# Patient Record
Sex: Female | Born: 1955
Health system: Southern US, Community
[De-identification: ages and names within clinical notes are randomized; demographics above are authoritative.]

## PROBLEM LIST (undated history)

## (undated) DIAGNOSIS — B019 Varicella without complication: Secondary | ICD-10-CM

## (undated) DIAGNOSIS — H269 Unspecified cataract: Secondary | ICD-10-CM

## (undated) HISTORY — DX: Varicella without complication: B01.9

## (undated) HISTORY — PX: WISDOM TOOTH EXTRACTION: SHX21

## (undated) HISTORY — DX: Unspecified cataract: H26.9

---

## 2013-09-13 HISTORY — PX: EYE SURGERY: SHX253

## 2015-06-30 ENCOUNTER — Encounter: Payer: Self-pay | Admitting: Nurse Practitioner

## 2015-06-30 ENCOUNTER — Ambulatory Visit (INDEPENDENT_AMBULATORY_CARE_PROVIDER_SITE_OTHER): Payer: BLUE CROSS/BLUE SHIELD | Admitting: Nurse Practitioner

## 2015-06-30 VITALS — BP 122/82 | HR 71 | Temp 98.4°F | Resp 16 | Ht 63.0 in | Wt 179.9 lb

## 2015-06-30 DIAGNOSIS — Z0000 Encounter for general adult medical examination without abnormal findings: Secondary | ICD-10-CM | POA: Diagnosis not present

## 2015-06-30 DIAGNOSIS — R739 Hyperglycemia, unspecified: Secondary | ICD-10-CM

## 2015-06-30 DIAGNOSIS — Z7689 Persons encountering health services in other specified circumstances: Secondary | ICD-10-CM | POA: Insufficient documentation

## 2015-06-30 NOTE — Progress Notes (Signed)
Patient ID: Erin Moses, female    DOB: 1956-04-04  Age: 59 y.o. MRN: [id number]  CC: Establish Care   HPI Erin Moses presents for establishing care with no chief complaints today.  1) New pt info:  Immunizations- tdap 2007, refuses flu, interested in Zostavax   Mammogram- 2010, normal per pt, self breast exams   Wants to wait till next year   Pap- 2015- normal per pt  Colonoscopy- Not UTD   Eye Exam- 12/2014, cataract in right eye removed   Dental Exam- dentures- partial upper, Not UTD  LMP- 2005   2) Chronic Problems-  Obese- 3-4 x a week going to [employer]   Diet- Cooks mostly   History Erin Moses has a past medical history of Chicken pox.   She has past surgical history that includes Eye surgery (2015).   Her family history includes Alcohol abuse in her father; Diabetes in her father; Heart disease in her paternal grandfather.She reports that she quit smoking about 8 years ago. Her smoking use included Cigarettes. She has never used smokeless tobacco. She reports that she drinks alcohol. She reports that she does not use illicit drugs.  No outpatient prescriptions prior to visit.   No facility-administered medications prior to visit.    ROS Review of Systems  Constitutional: Negative for fever, chills, diaphoresis and fatigue.  HENT: Negative for tinnitus and trouble swallowing.   Eyes: Negative for visual disturbance.  Respiratory: Negative for chest tightness, shortness of breath and wheezing.   Cardiovascular: Negative for chest pain, palpitations and leg swelling.  Gastrointestinal: Negative for nausea, vomiting and diarrhea.  Genitourinary: Negative for vaginal bleeding, vaginal discharge, difficulty urinating and vaginal pain.  Musculoskeletal: Negative for back pain and neck pain.  Skin: Negative for rash.  Neurological: Negative for dizziness, weakness, numbness and headaches.  Hematological: Does not bruise/bleed easily.  Psychiatric/Behavioral:  The patient is not nervous/anxious.     Objective:  BP 122/82 mmHg  Pulse 71  Temp(Src) 98.4 F (36.9 C)  Resp 16  Ht  (1.6 m)  Wt 179 lb 14.4 oz (81.602 kg)  BMI 31.88 kg/m2  SpO2 94%  Physical Exam  Constitutional: She is oriented to person, place, and time. She appears well-developed and well-nourished. No distress.  HENT:  Head: Normocephalic and atraumatic.  Right Ear: External ear normal.  Left Ear: External ear normal.  TMs clear bilaterally  Eyes: EOM are normal. Pupils are equal, round, and reactive to light. Right eye exhibits no discharge. Left eye exhibits no discharge. No scleral icterus.  Neck: Normal range of motion. Neck supple.  Cardiovascular: Normal rate, regular rhythm, normal heart sounds and intact distal pulses.  Exam reveals no gallop and no friction rub.   No murmur heard. Pulmonary/Chest: Effort normal and breath sounds normal. No respiratory distress. She has no wheezes. She has no rales. She exhibits no tenderness.  Abdominal: Soft. Bowel sounds are normal. She exhibits no distension and no mass. There is no tenderness. There is no rebound and no guarding.  Genitourinary:  Deferred per patient request  Musculoskeletal: Normal range of motion. She exhibits no edema or tenderness.  Lymphadenopathy:    She has no cervical adenopathy.  Neurological: She is alert and oriented to person, place, and time. No cranial nerve deficit. She exhibits normal muscle tone. Coordination normal.  Skin: Skin is warm and dry. No rash noted. She is not diaphoretic.  Psychiatric: She has a normal mood and affect. Her behavior is normal. Judgment and thought  content normal.      Assessment & Plan:   Erin Moses was seen today for establish care.  Diagnoses and all orders for this visit:  Encounter to establish care  Routine general medical examination at a health care facility -     Comprehensive metabolic panel; Future -     Hemoglobin A1c; Future -     Lipid  panel; Future -     CBC with Differential/Platelet; Future -     Vit D  25 hydroxy (rtn osteoporosis monitoring); Future  Elevated blood sugar   I am having Erin Moses maintain her aspirin, Multiple Vitamin (MULTI VITAMIN DAILY PO), and Melatonin.  Meds ordered this encounter  Medications  . aspirin 325 MG tablet    Sig: Take 1 tablet by mouth daily.  . Multiple Vitamin (MULTI VITAMIN DAILY PO)    Sig: Take 1 tablet by mouth daily.  . Melatonin 10 MG TBCR    Sig: Take 1 tablet by mouth at bedtime as needed.     Follow-up: Return if symptoms worsen or fail to improve, for Lab Appointment- Fasting labs.

## 2015-06-30 NOTE — Progress Notes (Signed)
Pre visit review using our clinic review tool, if applicable. No additional management support is needed unless otherwise documented below in the visit note. 

## 2015-06-30 NOTE — Patient Instructions (Signed)

## 2015-06-30 NOTE — Assessment & Plan Note (Signed)
Discussed acute and chronic issues. Reviewed health maintenance measures, PFSHx, and immunizations. Obtain routine labs TSH, Lipid panel, CBC w/ diff, A1c, and CMET.   

## 2015-07-02 DIAGNOSIS — R739 Hyperglycemia, unspecified: Secondary | ICD-10-CM | POA: Insufficient documentation

## 2015-07-02 DIAGNOSIS — Z87898 Personal history of other specified conditions: Secondary | ICD-10-CM | POA: Insufficient documentation

## 2015-07-02 DIAGNOSIS — Z8619 Personal history of other infectious and parasitic diseases: Secondary | ICD-10-CM | POA: Insufficient documentation

## 2015-07-02 NOTE — Assessment & Plan Note (Signed)
No CC today. Will do head to toe physical. Pt declined breast exam (seemed very cautious at today's visit).   Pt wants to wait on the following until next year- PAP, Breast exam, Mammogram.   Will obtain fasting labs at a lab visit.

## 2015-07-02 NOTE — Assessment & Plan Note (Signed)
Will obtain C met

## 2015-07-23 ENCOUNTER — Other Ambulatory Visit: Payer: Self-pay | Admitting: Nurse Practitioner

## 2015-07-23 ENCOUNTER — Telehealth: Payer: Self-pay | Admitting: Nurse Practitioner

## 2015-07-23 ENCOUNTER — Other Ambulatory Visit (INDEPENDENT_AMBULATORY_CARE_PROVIDER_SITE_OTHER): Payer: BLUE CROSS/BLUE SHIELD

## 2015-07-23 DIAGNOSIS — Z0000 Encounter for general adult medical examination without abnormal findings: Secondary | ICD-10-CM | POA: Diagnosis not present

## 2015-07-23 LAB — COMPREHENSIVE METABOLIC PANEL
ALBUMIN: 4.4 g/dL (ref 3.5–5.2)
ALT: 25 U/L (ref 0–35)
AST: 21 U/L (ref 0–37)
Alkaline Phosphatase: 91 U/L (ref 39–117)
BUN: 14 mg/dL (ref 6–23)
CHLORIDE: 99 meq/L (ref 96–112)
CO2: 27 meq/L (ref 19–32)
Calcium: 10.5 mg/dL (ref 8.4–10.5)
Creatinine, Ser: 0.81 mg/dL (ref 0.40–1.20)
GFR: 76.83 mL/min (ref >60.00)
Glucose, Bld: 108 mg/dL — ABNORMAL HIGH (ref 70–99)
POTASSIUM: 4.5 meq/L (ref 3.5–5.1)
SODIUM: 137 meq/L (ref 135–145)
Total Bilirubin: 0.7 mg/dL (ref 0.2–1.2)
Total Protein: 7.7 g/dL (ref 6.0–8.3)

## 2015-07-23 LAB — CBC WITH DIFFERENTIAL/PLATELET
BASOS PCT: 0.9 % (ref 0.0–3.0)
Basophils Absolute: 0.1 10*3/uL (ref 0.0–0.1)
EOS PCT: 1.8 % (ref 0.0–5.0)
Eosinophils Absolute: 0.1 10*3/uL (ref 0.0–0.7)
HEMATOCRIT: 42.4 % (ref 36.0–46.0)
HEMOGLOBIN: 14.2 g/dL (ref 12.0–15.0)
LYMPHS PCT: 29.5 % (ref 12.0–46.0)
Lymphs Abs: 2.1 10*3/uL (ref 0.7–4.0)
MCHC: 33.4 g/dL (ref 30.0–36.0)
MCV: 84.8 fl (ref 78.0–100.0)
MONOS PCT: 9.1 % (ref 3.0–12.0)
Monocytes Absolute: 0.6 10*3/uL (ref 0.1–1.0)
Neutro Abs: 4.1 10*3/uL (ref 1.4–7.7)
Neutrophils Relative %: 58.7 % (ref 43.0–77.0)
Platelets: 379 10*3/uL (ref 150.0–400.0)
RBC: 5 Mil/uL (ref 3.87–5.11)
RDW: 13.3 % (ref 11.5–15.5)
WBC: 7 10*3/uL (ref 4.0–10.5)

## 2015-07-23 LAB — LIPID PANEL
CHOLESTEROL: 207 mg/dL — AB (ref 0–200)
HDL: 53.7 mg/dL (ref >39.00)
LDL Cholesterol: 122 mg/dL — ABNORMAL HIGH (ref 0–99)
NONHDL: 153.31
Total CHOL/HDL Ratio: 4
Triglycerides: 155 mg/dL — ABNORMAL HIGH (ref 0.0–149.0)
VLDL: 31 mg/dL (ref 0.0–40.0)

## 2015-07-23 LAB — VITAMIN D 25 HYDROXY (VIT D DEFICIENCY, FRACTURES): VITD: 37.86 ng/mL (ref 30.00–100.00)

## 2015-07-23 LAB — HEMOGLOBIN A1C: HEMOGLOBIN A1C: 5.8 % (ref 4.6–6.5)

## 2015-07-23 MED ORDER — SCOPOLAMINE 1 MG/3DAYS TD PT72: 1.0000 | MEDICATED_PATCH | TRANSDERMAL | Status: DC

## 2015-07-23 NOTE — Telephone Encounter (Addendum)
Pt came in wanting to know if she could get a prescription for motion sickness patch pt is going on a cruise   .Marland Kitchen. Please advise pt

## 2015-07-23 NOTE — Telephone Encounter (Signed)
Message left on patient voicemail.

## 2015-07-23 NOTE — Telephone Encounter (Signed)
Can patient just purchased Dramamine or Bonne for patient

## 2015-07-23 NOTE — Telephone Encounter (Signed)
Sent to [facility] in [place] the scopolamine patch.

## 2015-07-23 NOTE — Telephone Encounter (Signed)
I'm confused sorry- is this cancelled shes already bought this? I can call in a scopolamine patch right now if she needs. Let me know which. Thanks!

## 2015-07-23 NOTE — Telephone Encounter (Signed)
Yes she wants something call in. Or can I recommend those medicine OTC?

## 2015-07-29 ENCOUNTER — Telehealth: Payer: Self-pay | Admitting: Nurse Practitioner

## 2015-07-29 NOTE — Telephone Encounter (Signed)
Patient called for lab results.

## 2015-07-29 NOTE — Telephone Encounter (Signed)
Spoke with patient, documentation is in a result note.

## 2018-01-30 ENCOUNTER — Telehealth: Payer: Self-pay | Admitting: Adult Health

## 2018-03-21 ENCOUNTER — Encounter: Payer: Self-pay | Admitting: Adult Health

## 2018-03-21 ENCOUNTER — Ambulatory Visit: Payer: BLUE CROSS/BLUE SHIELD | Admitting: Adult Health

## 2018-03-21 VITALS — BP 130/73 | HR 71 | Ht 63.0 in | Wt 183.5 lb

## 2018-03-21 DIAGNOSIS — Z1231 Encounter for screening mammogram for malignant neoplasm of breast: Secondary | ICD-10-CM

## 2018-03-21 DIAGNOSIS — Z23 Encounter for immunization: Secondary | ICD-10-CM | POA: Diagnosis not present

## 2018-03-21 DIAGNOSIS — Z0000 Encounter for general adult medical examination without abnormal findings: Secondary | ICD-10-CM

## 2018-03-21 DIAGNOSIS — Z114 Encounter for screening for human immunodeficiency virus [HIV]: Secondary | ICD-10-CM | POA: Diagnosis not present

## 2018-03-21 DIAGNOSIS — Z122 Encounter for screening for malignant neoplasm of respiratory organs: Secondary | ICD-10-CM

## 2018-03-21 DIAGNOSIS — R739 Hyperglycemia, unspecified: Secondary | ICD-10-CM

## 2018-03-21 DIAGNOSIS — Z1239 Encounter for other screening for malignant neoplasm of breast: Secondary | ICD-10-CM

## 2018-03-21 DIAGNOSIS — Z1159 Encounter for screening for other viral diseases: Secondary | ICD-10-CM | POA: Diagnosis not present

## 2018-03-21 DIAGNOSIS — L918 Other hypertrophic disorders of the skin: Secondary | ICD-10-CM

## 2018-03-21 NOTE — Patient Instructions (Signed)
Mediterranean Diet A Mediterranean diet refers to food and lifestyle choices that are based on the traditions of countries located on the [place] Sea. This way of eating has been shown to help prevent certain conditions and improve outcomes for people who have chronic diseases, like kidney disease and heart disease. What are tips for following this plan? Lifestyle  Cook and eat meals together with your family, when possible.  Drink enough fluid to keep your urine clear or pale yellow.  Be physically active every day. This includes: ? Aerobic exercise like running or swimming. ? Leisure activities like gardening, walking, or housework.  Get 7-8 hours of sleep each night.  If recommended by your health care provider, drink red wine in moderation. This means 1 glass a day for nonpregnant women and 2 glasses a day for men. A glass of wine equals 5 oz (150 mL). Reading food labels  Check the serving size of packaged foods. For foods such as rice and pasta, the serving size refers to the amount of cooked product, not dry.  Check the total fat in packaged foods. Avoid foods that have saturated fat or trans fats.  Check the ingredients list for added sugars, such as corn syrup. Shopping  At the grocery store, buy most of your food from the areas near the walls of the store. This includes: ? Fresh fruits and vegetables (produce). ? Grains, beans, nuts, and seeds. Some of these may be available in unpackaged forms or large amounts (in bulk). ? Fresh seafood. ? Poultry and eggs. ? Low-fat dairy products.  Buy whole ingredients instead of prepackaged foods.  Buy fresh fruits and vegetables in-season from local farmers markets.  Buy frozen fruits and vegetables in resealable bags.  If you do not have access to quality fresh seafood, buy precooked frozen shrimp or canned fish, such as tuna, salmon, or sardines.  Buy small amounts of raw or cooked vegetables, salads, or olives from the  deli or salad bar at your store.  Stock your pantry so you always have certain foods on hand, such as olive oil, canned tuna, canned tomatoes, rice, pasta, and beans. Cooking  Cook foods with extra-virgin olive oil instead of using butter or other vegetable oils.  Have meat as a side dish, and have vegetables or grains as your main dish. This means having meat in small portions or adding small amounts of meat to foods like pasta or stew.  Use beans or vegetables instead of meat in common dishes like chili or lasagna.  Experiment with different cooking methods. Try roasting or broiling vegetables instead of steaming or sauteing them.  Add frozen vegetables to soups, stews, pasta, or rice.  Add nuts or seeds for added healthy fat at each meal. You can add these to yogurt, salads, or vegetable dishes.  Marinate fish or vegetables using olive oil, lemon juice, garlic, and fresh herbs. Meal planning  Plan to eat 1 vegetarian meal one day each week. Try to work up to 2 vegetarian meals, if possible.  Eat seafood 2 or more times a week.  Have healthy snacks readily available, such as: ? Vegetable sticks with hummus. ? Greek yogurt. ? Fruit and nut trail mix.  Eat balanced meals throughout the week. This includes: ? Fruit: 2-3 servings a day ? Vegetables: 4-5 servings a day ? Low-fat dairy: 2 servings a day ? Fish, poultry, or lean meat: 1 serving a day ? Beans and legumes: 2 or more servings a week ? Nuts   and seeds: 1-2 servings a day ? Whole grains: 6-8 servings a day ? Extra-virgin olive oil: 3-4 servings a day  Limit red meat and sweets to only a few servings a month What are my food choices?  Mediterranean diet ? Recommended ? Grains: Whole-grain pasta. Brown rice. Bulgar wheat. Polenta. Couscous. Whole-wheat bread. Orpah Cobbatmeal. Quinoa. ? Vegetables: Artichokes. Beets. Broccoli. Cabbage. Carrots. Eggplant. Green beans. Chard. Kale. Spinach. Onions. Leeks. Peas. Squash.  Tomatoes. Peppers. Radishes. ? Fruits: Apples. Apricots. Avocado. Berries. Bananas. Cherries. Dates. Figs. Grapes. Lemons. Melon. Oranges. Peaches. Plums. Pomegranate. ? Meats and other protein foods: Beans. Almonds. Sunflower seeds. Pine nuts. Peanuts. Cod. Salmon. Scallops. Shrimp. Tuna. Tilapia. Clams. Oysters. Eggs. ? Dairy: Low-fat milk. Cheese. Greek yogurt. ? Beverages: Water. Red wine. Herbal tea. ? Fats and oils: Extra virgin olive oil. Avocado oil. Grape seed oil. ? Sweets and desserts: [place] yogurt with honey. Baked apples. Poached pears. Trail mix. ? Seasoning and other foods: Basil. Cilantro. Coriander. Cumin. Mint. Parsley. Sage. Rosemary. Tarragon. Garlic. Oregano. Thyme. Pepper. Balsalmic vinegar. Tahini. Hummus. Tomato sauce. Olives. Mushrooms. ? Limit these ? Grains: Prepackaged pasta or rice dishes. Prepackaged cereal with added sugar. ? Vegetables: Deep fried potatoes (french fries). ? Fruits: Fruit canned in syrup. ? Meats and other protein foods: Beef. Pork. Lamb. Poultry with skin. Hot dogs. Tomasa BlaseBacon. ? Dairy: Ice cream. Sour cream. Whole milk. ? Beverages: Juice. Sugar-sweetened soft drinks. Beer. Liquor and spirits. ? Fats and oils: Butter. Canola oil. Vegetable oil. Beef fat (tallow). Lard. ? Sweets and desserts: Cookies. Cakes. Pies. Candy. ? Seasoning and other foods: Mayonnaise. Premade sauces and marinades. ? The items listed may not be a complete list. Talk with your dietitian about what dietary choices are right for you. Summary  The Mediterranean diet includes both food and lifestyle choices.  Eat a variety of fresh fruits and vegetables, beans, nuts, seeds, and whole grains.  Limit the amount of red meat and sweets that you eat.  Talk with your health care provider about whether it is safe for you to drink red wine in moderation. This means 1 glass a day for nonpregnant women and 2 glasses a day for men. A glass of wine equals 5 oz (150 mL). This information  is not intended to replace advice given to you by your health care provider. Make sure you discuss any questions you have with your health care provider. Document Released: 04/22/2016 Document Revised: 05/25/2016 Document Reviewed: 04/22/2016 Elsevier Interactive Patient Education  2018 [employer]  Continue your excellent hydration and follow heart healthy diet. Continue your excellent exercise regime. Recommend complete physical with fasting labs in the next 3-6 months. Referral to Dermatology placed. WELCOME TO THE PRACTICE!

## 2018-03-21 NOTE — Assessment & Plan Note (Signed)
Continue your excellent hydration and follow heart healthy diet. Continue your excellent exercise regime. Recommend complete physical with fasting labs in the next 3-6 months. Referral to Dermatology placed.

## 2018-03-21 NOTE — Progress Notes (Signed)
Subjective:    Patient ID: Erin Moses, female    DOB: [birth year], 62 y.o.   MRN: [id number]  HPI:  Erin Moses is here to establish as a new pt.  She is a pleasant 62 year old female.   PMH:  She denies chronic medical conditions/daily medications. She drinks 60-80 oz water/day and follows a heart healthy diet. She exercises for 1 hr (cardio and wt training) 4 days a week. She denies tobacco use and minimally consumes ETOH on weekends. Overall she feels that her health is "good".  Patient Care Team    Relationship Specialty Notifications Start End  Julaine Fusi, NP PCP - General Family Medicine  01/27/18     Patient Active Problem List   Diagnosis Date Noted  . History of measles, mumps, or rubella 07/02/2015  . Elevated blood sugar 07/02/2015  . Encounter to establish care 06/30/2015  . Routine general medical examination at a health care facility 06/30/2015     Past Medical History:  Diagnosis Date  . Cataract   . Chicken pox      Past Surgical History:  Procedure Laterality Date  . EYE SURGERY  2015   Cataract Surgery  . WISDOM TOOTH EXTRACTION       Family History  Problem Relation Age of Onset  . Hypertension Mother   . Alcohol abuse Father   . Diabetes Father   . Heart disease Paternal Grandfather      Social History   Substance and Sexual Activity  Drug Use No     Social History   Substance and Sexual Activity  Alcohol Use Yes  . Alcohol/week: 4.8 oz  . Types: 8 Cans of beer per week     Social History   Tobacco Use  Smoking Status Former Smoker  . Packs/day: 1.50  . Years: 32.00  . Pack years: 48.00  . Types: Cigarettes  . Last attempt to quit: 06/30/2007  . Years since quitting: 10.7  Smokeless Tobacco Never Used     Outpatient Encounter Medications as of 03/21/2018  Medication Sig Note  . aspirin 325 MG tablet Take 1 tablet by mouth daily. 06/30/2015: Received from: [facility] Received Sig: Take by  mouth.  . diphenhydrAMINE (BENADRYL) 25 mg capsule Take 25 mg by mouth at bedtime. Take one or two capsules at bedtime as needed   . Melatonin 10 MG TBCR Take 1 tablet by mouth at bedtime as needed. 06/30/2015: Medication taken as needed.  Received from: [facility] Received Sig: Take by mouth.  . Multiple Vitamin (MULTI VITAMIN DAILY PO) Take 1 tablet by mouth daily. 06/30/2015: Received from: [facility] Received Sig: Take by mouth.  . [DISCONTINUED] scopolamine (TRANSDERM-SCOP, 1.5 MG,) 1 MG/3DAYS Place 1 patch (1.5 mg total) onto the skin every 3 (three) days.    No facility-administered encounter medications on file as of 03/21/2018.     Allergies: Patient has no known allergies.  Body mass index is 32.51 kg/m.  Blood pressure 130/73, pulse 71, height 5\' 3"  (1.6 m), weight 183 lb 8 oz (83.2 kg), SpO2 94 %.  Review of Systems  Constitutional: Positive for fatigue. Negative for activity change, appetite change, chills, diaphoresis, fever and unexpected weight change.  Eyes: Negative for visual disturbance.  Respiratory: Negative for cough, chest tightness, shortness of breath, wheezing and stridor.   Cardiovascular: Negative for chest pain, palpitations and leg swelling.  Gastrointestinal: Negative for abdominal distention, abdominal pain, blood in stool, constipation, diarrhea, nausea  and vomiting.  Endocrine: Negative for cold intolerance, heat intolerance, polydipsia, polyphagia and polyuria.  Genitourinary: Negative for difficulty urinating and flank pain.  Musculoskeletal: Negative for arthralgias, back pain, gait problem, joint swelling, myalgias, neck pain and neck stiffness.  Skin: Negative for color change, pallor, rash and wound.  Neurological: Negative for dizziness and headaches.  Hematological: Does not bruise/bleed easily.  Psychiatric/Behavioral: Negative for confusion, decreased concentration, dysphoric mood, hallucinations,  self-injury, sleep disturbance and suicidal ideas. The patient is not nervous/anxious and is not hyperactive.        Objective:   Physical Exam  Constitutional: She is oriented to person, place, and time. She appears well-developed and well-nourished. No distress.  HENT:  Head: Normocephalic and atraumatic.  Right Ear: External ear normal.  Left Ear: External ear normal.  Nose: Nose normal.  Mouth/Throat: Oropharynx is clear and moist.  Eyes: Pupils are equal, round, and reactive to light. Conjunctivae and EOM are normal.  Cardiovascular: Normal rate, regular rhythm, normal heart sounds and intact distal pulses.  No murmur heard. Pulmonary/Chest: Effort normal and breath sounds normal. No stridor. No respiratory distress. She has no wheezes. She has no rales. She exhibits no tenderness.  Neurological: She is alert and oriented to person, place, and time.  Skin: Skin is warm and dry. Capillary refill takes less than 2 seconds. No rash noted. She is not diaphoretic. No erythema. No pallor.  Psychiatric: She has a normal mood and affect. Her behavior is normal. Judgment and thought content normal.  Nursing note and vitals reviewed.     Assessment & Plan:   1. Screening for breast cancer   2. Healthcare maintenance   3. Screening for HIV (human immunodeficiency virus)   4. Need for hepatitis C screening test   5. Need for shingles vaccine   6. Elevated blood sugar   7. Encounter for screening for lung cancer   8. Routine general medical examination at a health care facility     Routine general medical examination at a health care facility Continue your excellent hydration and follow heart healthy diet. Continue your excellent exercise regime. Recommend complete physical with fasting labs in the next 3-6 months. Referral to Dermatology placed.    FOLLOW-UP:  Return in about 3 months (around 06/21/2018) for Fasting Labs, CPE.

## 2018-04-03 ENCOUNTER — Ambulatory Visit
Admission: RE | Admit: 2018-04-03 | Discharge: 2018-04-03 | Disposition: A | Discharge status: Home / Self Care | Payer: BLUE CROSS/BLUE SHIELD | Source: Ambulatory Visit | Attending: Adult Health | Admitting: Adult Health

## 2018-04-03 DIAGNOSIS — Z122 Encounter for screening for malignant neoplasm of respiratory organs: Secondary | ICD-10-CM

## 2018-04-03 DIAGNOSIS — Z87891 Personal history of nicotine dependence: Secondary | ICD-10-CM | POA: Diagnosis not present

## 2018-04-07 ENCOUNTER — Ambulatory Visit
Admission: RE | Admit: 2018-04-07 | Discharge: 2018-04-07 | Disposition: A | Discharge status: Home / Self Care | Payer: BLUE CROSS/BLUE SHIELD | Source: Ambulatory Visit | Attending: Adult Health | Admitting: Adult Health

## 2018-04-07 DIAGNOSIS — Z0000 Encounter for general adult medical examination without abnormal findings: Secondary | ICD-10-CM

## 2018-04-07 DIAGNOSIS — Z1231 Encounter for screening mammogram for malignant neoplasm of breast: Secondary | ICD-10-CM | POA: Insufficient documentation

## 2018-04-07 DIAGNOSIS — Z1239 Encounter for other screening for malignant neoplasm of breast: Secondary | ICD-10-CM

## 2018-04-25 DIAGNOSIS — D2261 Melanocytic nevi of right upper limb, including shoulder: Secondary | ICD-10-CM | POA: Diagnosis not present

## 2018-04-25 DIAGNOSIS — D2272 Melanocytic nevi of left lower limb, including hip: Secondary | ICD-10-CM | POA: Diagnosis not present

## 2018-04-25 DIAGNOSIS — D2262 Melanocytic nevi of left upper limb, including shoulder: Secondary | ICD-10-CM | POA: Diagnosis not present

## 2018-04-25 DIAGNOSIS — D2271 Melanocytic nevi of right lower limb, including hip: Secondary | ICD-10-CM | POA: Diagnosis not present

## 2018-06-26 ENCOUNTER — Other Ambulatory Visit: Payer: BLUE CROSS/BLUE SHIELD

## 2018-06-26 DIAGNOSIS — Z0000 Encounter for general adult medical examination without abnormal findings: Secondary | ICD-10-CM | POA: Diagnosis not present

## 2018-06-26 DIAGNOSIS — Z1159 Encounter for screening for other viral diseases: Secondary | ICD-10-CM

## 2018-06-26 DIAGNOSIS — Z114 Encounter for screening for human immunodeficiency virus [HIV]: Secondary | ICD-10-CM | POA: Diagnosis not present

## 2018-06-26 DIAGNOSIS — R739 Hyperglycemia, unspecified: Secondary | ICD-10-CM | POA: Diagnosis not present

## 2018-06-27 LAB — CBC WITH DIFFERENTIAL/PLATELET
BASOS: 1 %
Basophils Absolute: 0.1 10*3/uL (ref 0.0–0.2)
EOS (ABSOLUTE): 0.1 10*3/uL (ref 0.0–0.4)
EOS: 2 %
HEMATOCRIT: 40.9 % (ref 34.0–46.6)
Hemoglobin: 13.4 g/dL (ref 11.1–15.9)
Immature Grans (Abs): 0 10*3/uL (ref 0.0–0.1)
Immature Granulocytes: 0 %
LYMPHS ABS: 1.6 10*3/uL (ref 0.7–3.1)
Lymphs: 28 %
MCH: 27.8 pg (ref 26.6–33.0)
MCHC: 32.8 g/dL (ref 31.5–35.7)
MCV: 85 fL (ref 79–97)
MONOCYTES: 9 %
Monocytes Absolute: 0.5 10*3/uL (ref 0.1–0.9)
NEUTROS ABS: 3.5 10*3/uL (ref 1.4–7.0)
Neutrophils: 60 %
Platelets: 393 10*3/uL (ref 150–450)
RBC: 4.82 x10E6/uL (ref 3.77–5.28)
RDW: 12.7 % (ref 12.3–15.4)
WBC: 5.8 10*3/uL (ref 3.4–10.8)

## 2018-06-27 LAB — COMPREHENSIVE METABOLIC PANEL
A/G RATIO: 1.9 (ref 1.2–2.2)
ALT: 24 IU/L (ref 0–32)
AST: 16 IU/L (ref 0–40)
Albumin: 4.5 g/dL (ref 3.6–4.8)
Alkaline Phosphatase: 84 IU/L (ref 39–117)
BUN/Creatinine Ratio: 19 (ref 12–28)
BUN: 16 mg/dL (ref 8–27)
Bilirubin Total: 0.6 mg/dL (ref 0.0–1.2)
CALCIUM: 9.8 mg/dL (ref 8.7–10.3)
CO2: 23 mmol/L (ref 20–29)
CREATININE: 0.85 mg/dL (ref 0.57–1.00)
Chloride: 103 mmol/L (ref 96–106)
GFR calc non Af Amer: 74 mL/min/{1.73_m2} (ref >59)
GFR, EST AFRICAN AMERICAN: 85 mL/min/{1.73_m2} (ref >59)
GLOBULIN, TOTAL: 2.4 g/dL (ref 1.5–4.5)
Glucose: 125 mg/dL — ABNORMAL HIGH (ref 65–99)
Potassium: 4.4 mmol/L (ref 3.5–5.2)
Sodium: 143 mmol/L (ref 134–144)
TOTAL PROTEIN: 6.9 g/dL (ref 6.0–8.5)

## 2018-06-27 LAB — HIV ANTIBODY (ROUTINE TESTING W REFLEX): HIV SCREEN 4TH GENERATION: NONREACTIVE

## 2018-06-27 LAB — TSH: TSH: 2.5 u[IU]/mL (ref 0.450–4.500)

## 2018-06-27 LAB — LIPID PANEL
CHOLESTEROL TOTAL: 202 mg/dL — AB (ref 100–199)
Chol/HDL Ratio: 4.2 ratio (ref 0.0–4.4)
HDL: 48 mg/dL (ref >39)
LDL Calculated: 121 mg/dL — ABNORMAL HIGH (ref 0–99)
Triglycerides: 167 mg/dL — ABNORMAL HIGH (ref 0–149)
VLDL CHOLESTEROL CAL: 33 mg/dL (ref 5–40)

## 2018-06-27 LAB — HEMOGLOBIN A1C
Est. average glucose Bld gHb Est-mCnc: 117 mg/dL
Hgb A1c MFr Bld: 5.7 % — ABNORMAL HIGH (ref 4.8–5.6)

## 2018-06-27 LAB — HEPATITIS C ANTIBODY: Hep C Virus Ab: <0.1 s/co ratio (ref 0.0–0.9)

## 2018-06-28 NOTE — Progress Notes (Addendum)
Subjective:    Patient ID: Erin Moses, female    DOB: July 14, 1956, 62 y.o.   MRN: [id number]  HPI:03/21/18 OV:   Ms. Boster is here to establish as a new pt.  She is a pleasant 62 year old female.   PMH:  She denies chronic medical conditions/daily medications. She drinks 60-80 oz water/day and follows a heart healthy diet. She exercises for 1 hr (cardio and wt training) 4 days a week. She denies tobacco use and minimally consumes ETOH on weekends. Overall she feels that her health is "good".  07/03/18 OV: Ms. Giannone is here for CPE She continues to exercise regularly and follows a heart healthy diet, with occasional fast food on weekends. She continues to abstain from tobacco use, seldom consumes ETOH She denies acute complaints today Reviewed all recent labs- A1c 5.7, LDL 121  Healthcare Maintenance: PAP-completed today Mammogram-UTD Colonoscopy-declined, Cologuard ordered Immunizations-UTD LDCT- UTD, repeat in one year  Patient Care Team    Relationship Specialty Notifications Start End  Julaine Fusi, NP PCP - General Family Medicine  01/27/18     Patient Active Problem List   Diagnosis Date Noted  . Healthcare maintenance 07/03/2018  . Smoking history 07/03/2018  . History of measles, mumps, or rubella 07/02/2015  . Elevated blood sugar 07/02/2015  . Encounter to establish care 06/30/2015  . Routine general medical examination at a health care facility 06/30/2015     Past Medical History:  Diagnosis Date  . Cataract   . Chicken pox      Past Surgical History:  Procedure Laterality Date  . EYE SURGERY  2015   Cataract Surgery  . WISDOM TOOTH EXTRACTION       Family History  Problem Relation Age of Onset  . Hypertension Mother   . Alcohol abuse Father   . Diabetes Father   . Heart disease Paternal Grandfather      Social History   Substance and Sexual Activity  Drug Use No     Social History   Substance and Sexual Activity  Alcohol Use  Yes  . Alcohol/week: 8.0 standard drinks  . Types: 8 Cans of beer per week     Social History   Tobacco Use  Smoking Status Former Smoker  . Packs/day: 1.50  . Years: 32.00  . Pack years: 48.00  . Types: Cigarettes  . Last attempt to quit: 06/30/2007  . Years since quitting: 11.0  Smokeless Tobacco Never Used     Outpatient Encounter Medications as of 07/03/2018  Medication Sig Note  . aspirin 325 MG tablet Take 1 tablet by mouth daily. 06/30/2015: Received from: [facility] Received Sig: Take by mouth.  . diphenhydrAMINE (BENADRYL) 25 mg capsule Take 25 mg by mouth at bedtime. Take one or two capsules at bedtime as needed   . Melatonin 10 MG TBCR Take 1 tablet by mouth at bedtime as needed. 06/30/2015: Medication taken as needed.  Received from: [facility] Received Sig: Take by mouth.  . Multiple Vitamin (MULTI VITAMIN DAILY PO) Take 1 tablet by mouth daily. 06/30/2015: Received from: [facility] Received Sig: Take by mouth.   No facility-administered encounter medications on file as of 07/03/2018.     Allergies: Patient has no known allergies.  Body mass index is 32.72 kg/m.  Blood pressure 120/67, pulse 79, height 5\' 3"  (1.6 m), weight 184 lb 11.2 oz (83.8 kg), SpO2 96 %.  Review of Systems  Constitutional: Positive for fatigue. Negative for  activity change, appetite change, chills, diaphoresis, fever and unexpected weight change.  Eyes: Negative for visual disturbance.  Respiratory: Negative for cough, chest tightness, shortness of breath, wheezing and stridor.   Cardiovascular: Negative for chest pain, palpitations and leg swelling.  Gastrointestinal: Negative for abdominal distention, abdominal pain, blood in stool, constipation, diarrhea, nausea and vomiting.  Endocrine: Negative for cold intolerance, heat intolerance, polydipsia, polyphagia and polyuria.  Genitourinary: Negative for difficulty urinating and  flank pain.  Musculoskeletal: Negative for arthralgias, back pain, gait problem, joint swelling, myalgias, neck pain and neck stiffness.  Skin: Negative for color change, pallor, rash and wound.  Neurological: Negative for dizziness and headaches.  Hematological: Does not bruise/bleed easily.  Psychiatric/Behavioral: Negative for confusion, decreased concentration, dysphoric mood, hallucinations, self-injury, sleep disturbance and suicidal ideas. The patient is not nervous/anxious and is not hyperactive.        Objective:   Physical Exam  Constitutional: She is oriented to person, place, and time. She appears well-developed and well-nourished. No distress.  HENT:  Head: Normocephalic and atraumatic.  Right Ear: External ear normal. Tympanic membrane is not erythematous and not bulging. No decreased hearing is noted.  Left Ear: External ear normal. Tympanic membrane is not erythematous and not bulging. No decreased hearing is noted.  Nose: Nose normal. No mucosal edema or rhinorrhea. Right sinus exhibits no maxillary sinus tenderness and no frontal sinus tenderness. Left sinus exhibits no maxillary sinus tenderness and no frontal sinus tenderness.  Mouth/Throat: Uvula is midline, oropharynx is clear and moist and mucous membranes are normal. Normal dentition. Tonsils are 0 on the right. Tonsils are 0 on the left. No tonsillar exudate.  Eyes: Pupils are equal, round, and reactive to light. Conjunctivae and EOM are normal.  Neck: Normal range of motion. Neck supple.  Cardiovascular: Normal rate, regular rhythm, normal heart sounds and intact distal pulses.  No murmur heard. Pulmonary/Chest: Effort normal and breath sounds normal. No stridor. No respiratory distress. She has no decreased breath sounds. She has no wheezes. She has no rhonchi. She has no rales. She exhibits no tenderness. Right breast exhibits no inverted nipple, no mass, no nipple discharge, no skin change and no tenderness. Left  breast exhibits no inverted nipple, no mass, no nipple discharge, no skin change and no tenderness.  Abdominal: Soft. Bowel sounds are normal. She exhibits no distension and no mass. There is no tenderness. There is no rebound and no guarding. No hernia.  Genitourinary: Vagina normal. No vaginal discharge found.  Genitourinary Comments: Chaperone present during examination.  Musculoskeletal: Normal range of motion. She exhibits no edema or tenderness.  Lymphadenopathy:    She has no cervical adenopathy.  Neurological: She is alert and oriented to person, place, and time. Coordination normal.  Skin: Skin is warm and dry. Capillary refill takes less than 2 seconds. No rash noted. She is not diaphoretic. No erythema. No pallor.  Psychiatric: She has a normal mood and affect. Her behavior is normal. Judgment and thought content normal.  Nursing note and vitals reviewed.     Assessment & Plan:   1. Screening for colon cancer   2. Screening for cervical cancer   3. Healthcare maintenance   4. Smoking history     Healthcare maintenance Continue to drink plenty of water and follow heart healthy diet. Continue regular exercise. Overall your labs are quite good! Reduce saturated fat and sugar to help normalize LDL cholesterol and A1c. Recommend to repeat Low Dose CT in one year, re: smoking history.  We will call you when PAP results are available. Recommend annual physical with fasting labs.  Smoking history 04/03/18 LDCT IMPRESSION: 1. Lung-RADS 2, benign appearance or behavior. Continue annual screening with low-dose chest CT without contrast in 12 months. 2. Aortic atherosclerosis (ICD10-I70.0) and emphysema (ICD10-J43.9). Pt aware of results     FOLLOW-UP:  Return in about 1 year (around 07/04/2019) for CPE, Fasting Labs.

## 2018-07-03 ENCOUNTER — Other Ambulatory Visit (HOSPITAL_COMMUNITY)
Admission: RE | Admit: 2018-07-03 | Discharge: 2018-07-03 | Disposition: A | Discharge status: Home / Self Care | Payer: BLUE CROSS/BLUE SHIELD | Source: Ambulatory Visit | Attending: Adult Health | Admitting: Adult Health

## 2018-07-03 ENCOUNTER — Encounter: Payer: Self-pay | Admitting: Adult Health

## 2018-07-03 ENCOUNTER — Ambulatory Visit (INDEPENDENT_AMBULATORY_CARE_PROVIDER_SITE_OTHER): Payer: BLUE CROSS/BLUE SHIELD | Admitting: Adult Health

## 2018-07-03 VITALS — BP 120/67 | HR 79 | Ht 63.0 in | Wt 184.7 lb

## 2018-07-03 DIAGNOSIS — Z1211 Encounter for screening for malignant neoplasm of colon: Secondary | ICD-10-CM

## 2018-07-03 DIAGNOSIS — Z124 Encounter for screening for malignant neoplasm of cervix: Secondary | ICD-10-CM

## 2018-07-03 DIAGNOSIS — Z87891 Personal history of nicotine dependence: Secondary | ICD-10-CM | POA: Diagnosis not present

## 2018-07-03 DIAGNOSIS — Z0000 Encounter for general adult medical examination without abnormal findings: Secondary | ICD-10-CM | POA: Diagnosis not present

## 2018-07-03 HISTORY — DX: Personal history of nicotine dependence: Z87.891

## 2018-07-03 NOTE — Patient Instructions (Addendum)
Preventive Care for Adults, Female  A healthy lifestyle and preventive care can promote health and wellness. Preventive health guidelines for women include the following key practices.   A routine yearly physical is a good way to check with your health care provider about your health and preventive screening. It is a chance to share any concerns and updates on your health and to receive a thorough exam.   Visit your dentist for a routine exam and preventive care every 6 months. Brush your teeth twice a day and floss once a day. Good oral hygiene prevents tooth decay and gum disease.   The frequency of eye exams is based on your age, health, family medical history, use of contact lenses, and other factors. Follow your health care provider's recommendations for frequency of eye exams.   Eat a healthy diet. Foods like vegetables, fruits, whole grains, low-fat dairy products, and lean protein foods contain the nutrients you need without too many calories. Decrease your intake of foods high in solid fats, added sugars, and salt. Eat the right amount of calories for you.Get information about a proper diet from your health care provider, if necessary.   Regular physical exercise is one of the most important things you can do for your health. Most adults should get at least 150 minutes of moderate-intensity exercise (any activity that increases your heart rate and causes you to sweat) each week. In addition, most adults need muscle-strengthening exercises on 2 or more days a week.   Maintain a healthy weight. The body mass index (BMI) is a screening tool to identify possible weight problems. It provides an estimate of body fat based on height and weight. Your health care provider can find your BMI, and can help you achieve or maintain a healthy weight.For adults 20 years and older:   - A BMI below 18.5 is considered underweight.   - A BMI of 18.5 to 24.9 is normal.   - A BMI of 25 to 29.9 is  considered overweight.   - A BMI of 30 and above is considered obese.   Maintain normal blood lipids and cholesterol levels by exercising and minimizing your intake of trans and saturated fats.  Eat a balanced diet with plenty of fruit and vegetables. Blood tests for lipids and cholesterol should begin at age 20 and be repeated every 5 years minimum.  If your lipid or cholesterol levels are high, you are over 40, or you are at high risk for heart disease, you may need your cholesterol levels checked more frequently.Ongoing high lipid and cholesterol levels should be treated with medicines if diet and exercise are not working.   If you smoke, find out from your health care provider how to quit. If you do not use tobacco, do not start.   Lung cancer screening is recommended for adults aged 55-80 years who are at high risk for developing lung cancer because of a history of smoking. A yearly low-dose CT scan of the lungs is recommended for people who have at least a 30-pack-year history of smoking and are a current smoker or have quit within the past 15 years. A pack year of smoking is smoking an average of 1 pack of cigarettes a day for 1 year (for example: 1 pack a day for 30 years or 2 packs a day for 15 years). Yearly screening should continue until the smoker has stopped smoking for at least 15 years. Yearly screening should be stopped for people who develop a   health problem that would prevent them from having lung cancer treatment.   If you are pregnant, do not drink alcohol. If you are breastfeeding, be very cautious about drinking alcohol. If you are not pregnant and choose to drink alcohol, do not have more than 1 drink per day. One drink is considered to be 12 ounces (355 mL) of beer, 5 ounces (148 mL) of wine, or 1.5 ounces (44 mL) of liquor.   Avoid use of street drugs. Do not share needles with anyone. Ask for help if you need support or instructions about stopping the use of  drugs.   High blood pressure causes heart disease and increases the risk of stroke. Your blood pressure should be checked at least yearly.  Ongoing high blood pressure should be treated with medicines if weight loss and exercise do not work.   If you are 69-55 years old, ask your health care provider if you should take aspirin to prevent strokes.   Diabetes screening involves taking a blood sample to check your fasting blood sugar level. This should be done once every 3 years, after age 38, if you are within normal weight and without risk factors for diabetes. Testing should be considered at a younger age or be carried out more frequently if you are overweight and have at least 1 risk factor for diabetes.   Breast cancer screening is essential preventive care for women. You should practice "breast self-awareness."  This means understanding the normal appearance and feel of your breasts and may include breast self-examination.  Any changes detected, no matter how small, should be reported to a health care provider.  Women in their 80s and 30s should have a clinical breast exam (CBE) by a health care provider as part of a regular health exam every 1 to 3 years.  After age 66, women should have a CBE every year.  Starting at age 1, women should consider having a mammogram (breast X-ray test) every year.  Women who have a family history of breast cancer should talk to their health care provider about genetic screening.  Women at a high risk of breast cancer should talk to their health care providers about having an MRI and a mammogram every year.   -Breast cancer gene (BRCA)-related cancer risk assessment is recommended for women who have family members with BRCA-related cancers. BRCA-related cancers include breast, ovarian, tubal, and peritoneal cancers. Having family members with these cancers may be associated with an increased risk for harmful changes (mutations) in the breast cancer genes BRCA1 and  BRCA2. Results of the assessment will determine the need for genetic counseling and BRCA1 and BRCA2 testing.   The Pap test is a screening test for cervical cancer. A Pap test can show cell changes on the cervix that might become cervical cancer if left untreated. A Pap test is a procedure in which cells are obtained and examined from the lower end of the uterus (cervix).   - Women should have a Pap test starting at age 57.   - Between ages 90 and 70, Pap tests should be repeated every 2 years.   - Beginning at age 63, you should have a Pap test every 3 years as long as the past 3 Pap tests have been normal.   - Some women have medical problems that increase the chance of getting cervical cancer. Talk to your health care provider about these problems. It is especially important to talk to your health care provider if a  new problem develops soon after your last Pap test. In these cases, your health care provider may recommend more frequent screening and Pap tests.   - The above recommendations are the same for women who have or have not gotten the vaccine for human papillomavirus (HPV).   - If you had a hysterectomy for a problem that was not cancer or a condition that could lead to cancer, then you no longer need Pap tests. Even if you no longer need a Pap test, a regular exam is a good idea to make sure no other problems are starting.   - If you are between ages 36 and 66 years, and you have had normal Pap tests going back 10 years, you no longer need Pap tests. Even if you no longer need a Pap test, a regular exam is a good idea to make sure no other problems are starting.   - If you have had past treatment for cervical cancer or a condition that could lead to cancer, you need Pap tests and screening for cancer for at least 20 years after your treatment.   - If Pap tests have been discontinued, risk factors (such as a new sexual partner) need to be reassessed to determine if screening should  be resumed.   - The HPV test is an additional test that may be used for cervical cancer screening. The HPV test looks for the virus that can cause the cell changes on the cervix. The cells collected during the Pap test can be tested for HPV. The HPV test could be used to screen women aged 70 years and older, and should be used in women of any age who have unclear Pap test results. After the age of 67, women should have HPV testing at the same frequency as a Pap test.   Colorectal cancer can be detected and often prevented. Most routine colorectal cancer screening begins at the age of 57 years and continues through age 26 years. However, your health care provider may recommend screening at an earlier age if you have risk factors for colon cancer. On a yearly basis, your health care provider may provide home test kits to check for hidden blood in the stool.  Use of a small camera at the end of a tube, to directly examine the colon (sigmoidoscopy or colonoscopy), can detect the earliest forms of colorectal cancer. Talk to your health care provider about this at age 23, when routine screening begins. Direct exam of the colon should be repeated every 5 -10 years through age 49 years, unless early forms of pre-cancerous polyps or small growths are found.   People who are at an increased risk for hepatitis B should be screened for this virus. You are considered at high risk for hepatitis B if:  -You were born in a country where hepatitis B occurs often. Talk with your health care provider about which countries are considered high risk.  - Your parents were born in a high-risk country and you have not received a shot to protect against hepatitis B (hepatitis B vaccine).  - You have HIV or AIDS.  - You use needles to inject street drugs.  - You live with, or have sex with, someone who has Hepatitis B.  - You get hemodialysis treatment.  - You take certain medicines for conditions like cancer, organ  transplantation, and autoimmune conditions.   Hepatitis C blood testing is recommended for all people born from [birth year] through 1965 and any individual  with known risks for hepatitis C.   Practice safe sex. Use condoms and avoid high-risk sexual practices to reduce the spread of sexually transmitted infections (STIs). STIs include gonorrhea, chlamydia, syphilis, trichomonas, herpes, HPV, and human immunodeficiency virus (HIV). Herpes, HIV, and HPV are viral illnesses that have no cure. They can result in disability, cancer, and death. Sexually active women aged 25 years and younger should be checked for chlamydia. Older women with new or multiple partners should also be tested for chlamydia. Testing for other STIs is recommended if you are sexually active and at increased risk.   Osteoporosis is a disease in which the bones lose minerals and strength with aging. This can result in serious bone fractures or breaks. The risk of osteoporosis can be identified using a bone density scan. Women ages 65 years and over and women at risk for fractures or osteoporosis should discuss screening with their health care providers. Ask your health care provider whether you should take a calcium supplement or vitamin D to There are also several preventive steps women can take to avoid osteoporosis and resulting fractures or to keep osteoporosis from worsening. -->Recommendations include:  Eat a balanced diet high in fruits, vegetables, calcium, and vitamins.  Get enough calcium. The recommended total intake of is 1,200 mg daily; for best absorption, if taking supplements, divide doses into 250-500 mg doses throughout the day. Of the two types of calcium, calcium carbonate is best absorbed when taken with food but calcium citrate can be taken on an empty stomach.  Get enough vitamin D. NAMS and the National Osteoporosis Foundation recommend at least 1,000 IU per day for women age 50 and over who are at risk of vitamin D  deficiency. Vitamin D deficiency can be caused by inadequate sun exposure (for example, those who live in northern latitudes).  Avoid alcohol and smoking. Heavy alcohol intake (more than 7 drinks per week) increases the risk of falls and hip fracture and women smokers tend to lose bone more rapidly and have lower bone mass than nonsmokers. Stopping smoking is one of the most important changes women can make to improve their health and decrease risk for disease.  Be physically active every day. Weight-bearing exercise (for example, fast walking, hiking, jogging, and weight training) may strengthen bones or slow the rate of bone loss that comes with aging. Balancing and muscle-strengthening exercises can reduce the risk of falling and fracture.  Consider therapeutic medications. Currently, several types of effective drugs are available. Healthcare providers can recommend the type most appropriate for each woman.  Eliminate environmental factors that may contribute to accidents. Falls cause nearly 90% of all osteoporotic fractures, so reducing this risk is an important bone-health strategy. Measures include ample lighting, removing obstructions to walking, using nonskid rugs on floors, and placing mats and/or grab bars in showers.  Be aware of medication side effects. Some common medicines make bones weaker. These include a type of steroid drug called glucocorticoids used for arthritis and asthma, some antiseizure drugs, certain sleeping pills, treatments for endometriosis, and some cancer drugs. An overactive thyroid gland or using too much thyroid hormone for an underactive thyroid can also be a problem. If you are taking these medicines, talk to your doctor about what you can do to help protect your bones.reduce the rate of osteoporosis.    Menopause can be associated with physical symptoms and risks. Hormone replacement therapy is available to decrease symptoms and risks. You should talk to your  health care provider   about whether hormone replacement therapy is right for you.   Use sunscreen. Apply sunscreen liberally and repeatedly throughout the day. You should seek shade when your shadow is shorter than you. Protect yourself by wearing long sleeves, pants, a wide-brimmed hat, and sunglasses year round, whenever you are outdoors.   Once a month, do a whole body skin exam, using a mirror to look at the skin on your back. Tell your health care provider of new moles, moles that have irregular borders, moles that are larger than a pencil eraser, or moles that have changed in shape or color.   -Stay current with required vaccines (immunizations).   Influenza vaccine. All adults should be immunized every year.  Tetanus, diphtheria, and acellular pertussis (Td, Tdap) vaccine. Pregnant women should receive 1 dose of Tdap vaccine during each pregnancy. The dose should be obtained regardless of the length of time since the last dose. Immunization is preferred during the 27th 36th week of gestation. An adult who has not previously received Tdap or who does not know her vaccine status should receive 1 dose of Tdap. This initial dose should be followed by tetanus and diphtheria toxoids (Td) booster doses every 10 years. Adults with an unknown or incomplete history of completing a 3-dose immunization series with Td-containing vaccines should begin or complete a primary immunization series including a Tdap dose. Adults should receive a Td booster every 10 years.  Varicella vaccine. An adult without evidence of immunity to varicella should receive 2 doses or a second dose if she has previously received 1 dose. Pregnant females who do not have evidence of immunity should receive the first dose after pregnancy. This first dose should be obtained before leaving the health care facility. The second dose should be obtained 4 8 weeks after the first dose.  Human papillomavirus (HPV) vaccine. Females aged 13 26  years who have not received the vaccine previously should obtain the 3-dose series. The vaccine is not recommended for use in pregnant females. However, pregnancy testing is not needed before receiving a dose. If a female is found to be pregnant after receiving a dose, no treatment is needed. In that case, the remaining doses should be delayed until after the pregnancy. Immunization is recommended for any person with an immunocompromised condition through the age of 26 years if she did not get any or all doses earlier. During the 3-dose series, the second dose should be obtained 4 8 weeks after the first dose. The third dose should be obtained 24 weeks after the first dose and 16 weeks after the second dose.  Zoster vaccine. One dose is recommended for adults aged 60 years or older unless certain conditions are present.  Measles, mumps, and rubella (MMR) vaccine. Adults born before 1957 generally are considered immune to measles and mumps. Adults born in 1957 or later should have 1 or more doses of MMR vaccine unless there is a contraindication to the vaccine or there is laboratory evidence of immunity to each of the three diseases. A routine second dose of MMR vaccine should be obtained at least 28 days after the first dose for students attending postsecondary schools, health care workers, or international travelers. People who received inactivated measles vaccine or an unknown type of measles vaccine during 1963 1967 should receive 2 doses of MMR vaccine. People who received inactivated mumps vaccine or an unknown type of mumps vaccine before 1979 and are at high risk for mumps infection should consider immunization with 2 doses of   MMR vaccine. For females of childbearing age, rubella immunity should be determined. If there is no evidence of immunity, females who are not pregnant should be vaccinated. If there is no evidence of immunity, females who are pregnant should delay immunization until after pregnancy.  Unvaccinated health care workers born before [birth year] who lack laboratory evidence of measles, mumps, or rubella immunity or laboratory confirmation of disease should consider measles and mumps immunization with 2 doses of MMR vaccine or rubella immunization with 1 dose of MMR vaccine.  Pneumococcal 13-valent conjugate (PCV13) vaccine. When indicated, a person who is uncertain of her immunization history and has no record of immunization should receive the PCV13 vaccine. An adult aged 54 years or older who has certain medical conditions and has not been previously immunized should receive 1 dose of PCV13 vaccine. This PCV13 should be followed with a dose of pneumococcal polysaccharide (PPSV23) vaccine. The PPSV23 vaccine dose should be obtained at least 8 weeks after the dose of PCV13 vaccine. An adult aged 58 years or older who has certain medical conditions and previously received 1 or more doses of PPSV23 vaccine should receive 1 dose of PCV13. The PCV13 vaccine dose should be obtained 1 or more years after the last PPSV23 vaccine dose.  Pneumococcal polysaccharide (PPSV23) vaccine. When PCV13 is also indicated, PCV13 should be obtained first. All adults aged 58 years and older should be immunized. An adult younger than age 65 years who has certain medical conditions should be immunized. Any person who resides in a nursing home or long-term care facility should be immunized. An adult smoker should be immunized. People with an immunocompromised condition and certain other conditions should receive both PCV13 and PPSV23 vaccines. People with human immunodeficiency virus (HIV) infection should be immunized as soon as possible after diagnosis. Immunization during chemotherapy or radiation therapy should be avoided. Routine use of PPSV23 vaccine is not recommended for American Indians, [place] Natives, or people younger than 65 years unless there are medical conditions that require PPSV23 vaccine. When indicated,  people who have unknown immunization and have no record of immunization should receive PPSV23 vaccine. One-time revaccination 5 years after the first dose of PPSV23 is recommended for people aged 70 64 years who have chronic kidney failure, nephrotic syndrome, asplenia, or immunocompromised conditions. People who received 1 2 doses of PPSV23 before age 32 years should receive another dose of PPSV23 vaccine at age 96 years or later if at least 5 years have passed since the previous dose. Doses of PPSV23 are not needed for people immunized with PPSV23 at or after age 55 years.  Meningococcal vaccine. Adults with asplenia or persistent complement component deficiencies should receive 2 doses of quadrivalent meningococcal conjugate (MenACWY-D) vaccine. The doses should be obtained at least 2 months apart. Microbiologists working with certain meningococcal bacteria, [employer] recruits, people at risk during an outbreak, and people who travel to or live in countries with a high rate of meningitis should be immunized. A first-year college student up through age 58 years who is living in a residence hall should receive a dose if she did not receive a dose on or after her 16th birthday. Adults who have certain high-risk conditions should receive one or more doses of vaccine.  Hepatitis A vaccine. Adults who wish to be protected from this disease, have certain high-risk conditions, work with hepatitis A-infected animals, work in hepatitis A research labs, or travel to or work in countries with a high rate of hepatitis A should be  immunized. Adults who were previously unvaccinated and who anticipate close contact with an international adoptee during the first 60 days after arrival in the [place] from a country with a high rate of hepatitis A should be immunized.  Hepatitis B vaccine.  Adults who wish to be protected from this disease, have certain high-risk conditions, may be exposed to blood or other infectious  body fluids, are household contacts or sex partners of hepatitis B positive people, are clients or workers in certain care facilities, or travel to or work in countries with a high rate of hepatitis B should be immunized.  Haemophilus influenzae type b (Hib) vaccine. A previously unvaccinated person with asplenia or sickle cell disease or having a scheduled splenectomy should receive 1 dose of Hib vaccine. Regardless of previous immunization, a recipient of a hematopoietic stem cell transplant should receive a 3-dose series 6 12 months after her successful transplant. Hib vaccine is not recommended for adults with HIV infection.  Preventive Services / Frequency Ages 6 to 39years  Blood pressure check.** / Every 1 to 2 years.  Lipid and cholesterol check.** / Every 5 years beginning at age 39.  Clinical breast exam.** / Every 3 years for women in their 61s and 62s.  BRCA-related cancer risk assessment.** / For women who have family members with a BRCA-related cancer (breast, ovarian, tubal, or peritoneal cancers).  Pap test.** / Every 2 years from ages 47 through 85. Every 3 years starting at age 34 through age 12 or 74 with a history of 3 consecutive normal Pap tests.  HPV screening.** / Every 3 years from ages 46 through ages 43 to 54 with a history of 3 consecutive normal Pap tests.  Hepatitis C blood test.** / For any individual with known risks for hepatitis C.  Skin self-exam. / Monthly.  Influenza vaccine. / Every year.  Tetanus, diphtheria, and acellular pertussis (Tdap, Td) vaccine.** / Consult your health care provider. Pregnant women should receive 1 dose of Tdap vaccine during each pregnancy. 1 dose of Td every 10 years.  Varicella vaccine.** / Consult your health care provider. Pregnant females who do not have evidence of immunity should receive the first dose after pregnancy.  HPV vaccine. / 3 doses over 6 months, if 64 and younger. The vaccine is not recommended for use in  pregnant females. However, pregnancy testing is not needed before receiving a dose.  Measles, mumps, rubella (MMR) vaccine.** / You need at least 1 dose of MMR if you were born in 1957 or later. You may also need a 2nd dose. For females of childbearing age, rubella immunity should be determined. If there is no evidence of immunity, females who are not pregnant should be vaccinated. If there is no evidence of immunity, females who are pregnant should delay immunization until after pregnancy.  Pneumococcal 13-valent conjugate (PCV13) vaccine.** / Consult your health care provider.  Pneumococcal polysaccharide (PPSV23) vaccine.** / 1 to 2 doses if you smoke cigarettes or if you have certain conditions.  Meningococcal vaccine.** / 1 dose if you are age 71 to 37 years and a Market researcher living in a residence hall, or have one of several medical conditions, you need to get vaccinated against meningococcal disease. You may also need additional booster doses.  Hepatitis A vaccine.** / Consult your health care provider.  Hepatitis B vaccine.** / Consult your health care provider.  Haemophilus influenzae type b (Hib) vaccine.** / Consult your health care provider.  Ages 55 to 64years  Blood pressure check.** / Every 1 to 2 years.  Lipid and cholesterol check.** / Every 5 years beginning at age 20 years.  Lung cancer screening. / Every year if you are aged 55 80 years and have a 30-pack-year history of smoking and currently smoke or have quit within the past 15 years. Yearly screening is stopped once you have quit smoking for at least 15 years or develop a health problem that would prevent you from having lung cancer treatment.  Clinical breast exam.** / Every year after age 40 years.  BRCA-related cancer risk assessment.** / For women who have family members with a BRCA-related cancer (breast, ovarian, tubal, or peritoneal cancers).  Mammogram.** / Every year beginning at age 40  years and continuing for as long as you are in good health. Consult with your health care provider.  Pap test.** / Every 3 years starting at age 30 years through age 65 or 70 years with a history of 3 consecutive normal Pap tests.  HPV screening.** / Every 3 years from ages 30 years through ages 65 to 70 years with a history of 3 consecutive normal Pap tests.  Fecal occult blood test (FOBT) of stool. / Every year beginning at age 50 years and continuing until age 75 years. You may not need to do this test if you get a colonoscopy every 10 years.  Flexible sigmoidoscopy or colonoscopy.** / Every 5 years for a flexible sigmoidoscopy or every 10 years for a colonoscopy beginning at age 50 years and continuing until age 75 years.  Hepatitis C blood test.** / For all people born from 1945 through 1965 and any individual with known risks for hepatitis C.  Skin self-exam. / Monthly.  Influenza vaccine. / Every year.  Tetanus, diphtheria, and acellular pertussis (Tdap/Td) vaccine.** / Consult your health care provider. Pregnant women should receive 1 dose of Tdap vaccine during each pregnancy. 1 dose of Td every 10 years.  Varicella vaccine.** / Consult your health care provider. Pregnant females who do not have evidence of immunity should receive the first dose after pregnancy.  Zoster vaccine.** / 1 dose for adults aged 60 years or older.  Measles, mumps, rubella (MMR) vaccine.** / You need at least 1 dose of MMR if you were born in 1957 or later. You may also need a 2nd dose. For females of childbearing age, rubella immunity should be determined. If there is no evidence of immunity, females who are not pregnant should be vaccinated. If there is no evidence of immunity, females who are pregnant should delay immunization until after pregnancy.  Pneumococcal 13-valent conjugate (PCV13) vaccine.** / Consult your health care provider.  Pneumococcal polysaccharide (PPSV23) vaccine.** / 1 to 2 doses if  you smoke cigarettes or if you have certain conditions.  Meningococcal vaccine.** / Consult your health care provider.  Hepatitis A vaccine.** / Consult your health care provider.  Hepatitis B vaccine.** / Consult your health care provider.  Haemophilus influenzae type b (Hib) vaccine.** / Consult your health care provider.  Ages 65 years and over  Blood pressure check.** / Every 1 to 2 years.  Lipid and cholesterol check.** / Every 5 years beginning at age 20 years.  Lung cancer screening. / Every year if you are aged 55 80 years and have a 30-pack-year history of smoking and currently smoke or have quit within the past 15 years. Yearly screening is stopped once you have quit smoking for at least 15 years or develop a health problem that   would prevent you from having lung cancer treatment.  Clinical breast exam.** / Every year after age 103 years.  BRCA-related cancer risk assessment.** / For women who have family members with a BRCA-related cancer (breast, ovarian, tubal, or peritoneal cancers).  Mammogram.** / Every year beginning at age 36 years and continuing for as long as you are in good health. Consult with your health care provider.  Pap test.** / Every 3 years starting at age 5 years through age 85 or 10 years with 3 consecutive normal Pap tests. Testing can be stopped between 65 and 70 years with 3 consecutive normal Pap tests and no abnormal Pap or HPV tests in the past 10 years.  HPV screening.** / Every 3 years from ages 93 years through ages 70 or 45 years with a history of 3 consecutive normal Pap tests. Testing can be stopped between 65 and 70 years with 3 consecutive normal Pap tests and no abnormal Pap or HPV tests in the past 10 years.  Fecal occult blood test (FOBT) of stool. / Every year beginning at age 8 years and continuing until age 45 years. You may not need to do this test if you get a colonoscopy every 10 years.  Flexible sigmoidoscopy or colonoscopy.** /  Every 5 years for a flexible sigmoidoscopy or every 10 years for a colonoscopy beginning at age 69 years and continuing until age 68 years.  Hepatitis C blood test.** / For all people born from [birth year] through 1965 and any individual with known risks for hepatitis C.  Osteoporosis screening.** / A one-time screening for women ages 7 years and over and women at risk for fractures or osteoporosis.  Skin self-exam. / Monthly.  Influenza vaccine. / Every year.  Tetanus, diphtheria, and acellular pertussis (Tdap/Td) vaccine.** / 1 dose of Td every 10 years.  Varicella vaccine.** / Consult your health care provider.  Zoster vaccine.** / 1 dose for adults aged 5 years or older.  Pneumococcal 13-valent conjugate (PCV13) vaccine.** / Consult your health care provider.  Pneumococcal polysaccharide (PPSV23) vaccine.** / 1 dose for all adults aged 74 years and older.  Meningococcal vaccine.** / Consult your health care provider.  Hepatitis A vaccine.** / Consult your health care provider.  Hepatitis B vaccine.** / Consult your health care provider.  Haemophilus influenzae type b (Hib) vaccine.** / Consult your health care provider. ** Family history and personal history of risk and conditions may change your health care provider's recommendations. Document Released: 10/26/2001 Document Revised: 06/20/2013  [facility] Patient Information 2014 [place], [place].   EXERCISE AND DIET:  We recommended that you start or continue a regular exercise program for good health. Regular exercise means any activity that makes your heart beat faster and makes you sweat.  We recommend exercising at least 30 minutes per day at least 3 days a week, preferably 5.  We also recommend a diet low in fat and sugar / carbohydrates.  Inactivity, poor dietary choices and obesity can cause diabetes, heart attack, stroke, and kidney damage, among others.     ALCOHOL AND SMOKING:  Women should limit their alcohol intake to no  more than 7 drinks/beers/glasses of wine (combined, not each!) per week. Moderation of alcohol intake to this level decreases your risk of breast cancer and liver damage.  ( And of course, no recreational drugs are part of a healthy lifestyle.)  Also, you should not be smoking at all or even being exposed to second hand smoke. Most people know smoking can  cause cancer, and various heart and lung diseases, but did you know it also contributes to weakening of your bones?  Aging of your skin?  Yellowing of your teeth and nails?   CALCIUM AND VITAMIN D:  Adequate intake of calcium and Vitamin D are recommended.  The recommendations for exact amounts of these supplements seem to change often, but generally speaking 600 mg of calcium (either carbonate or citrate) and 800 units of Vitamin D per day seems prudent. Certain women may benefit from higher intake of Vitamin D.  If you are among these women, your doctor will have told you during your visit.     PAP SMEARS:  Pap smears, to check for cervical cancer or precancers,  have traditionally been done yearly, although recent scientific advances have shown that most women can have pap smears less often.  However, every woman still should have a physical exam from her gynecologist or primary care physician every year. It will include a breast check, inspection of the vulva and vagina to check for abnormal growths or skin changes, a visual exam of the cervix, and then an exam to evaluate the size and shape of the uterus and ovaries.  And after 62 years of age, a rectal exam is indicated to check for rectal cancers. We will also provide age appropriate advice regarding health maintenance, like when you should have certain vaccines, screening for sexually transmitted diseases, bone density testing, colonoscopy, mammograms, etc.    MAMMOGRAMS:  All women over 65 years old should have a yearly mammogram. Many facilities now offer a "3D" mammogram, which may cost  around $50 extra out of pocket. If possible,  we recommend you accept the option to have the 3D mammogram performed.  It both reduces the number of women who will be called back for extra views which then turn out to be normal, and it is better than the routine mammogram at detecting truly abnormal areas.     COLONOSCOPY:  Colonoscopy to screen for colon cancer is recommended for all women at age 69.  We know, you hate the idea of the prep.  We agree, BUT, having colon cancer and not knowing it is worse!!  Colon cancer so often starts as a polyp that can be seen and removed at colonscopy, which can quite literally save your life!  And if your first colonoscopy is normal and you have no family history of colon cancer, most women don't have to have it again for 10 years.  Once every ten years, you can do something that may end up saving your life, right?  We will be happy to help you get it scheduled when you are ready.  Be sure to check your insurance coverage so you understand how much it will cost.  It may be covered as a preventative service at no cost, but you should check your particular policy.     Mediterranean Diet A Mediterranean diet refers to food and lifestyle choices that are based on the traditions of countries located on the [employer]. This way of eating has been shown to help prevent certain conditions and improve outcomes for people who have chronic diseases, like kidney disease and heart disease. What are tips for following this plan? Lifestyle  Cook and eat meals together with your family, when possible.  Drink enough fluid to keep your urine clear or pale yellow.  Be physically active every day. This includes: ? Aerobic exercise like running or swimming. ? Leisure activities  like gardening, walking, or housework.  Get 7-8 hours of sleep each night.  If recommended by your health care provider, drink red wine in moderation. This means 1 glass a day for nonpregnant  women and 2 glasses a day for men. A glass of wine equals 5 oz (150 mL). Reading food labels  Check the serving size of packaged foods. For foods such as rice and pasta, the serving size refers to the amount of cooked product, not dry.  Check the total fat in packaged foods. Avoid foods that have saturated fat or trans fats.  Check the ingredients list for added sugars, such as corn syrup. Shopping  At the grocery store, buy most of your food from the areas near the walls of the store. This includes: ? Fresh fruits and vegetables (produce). ? Grains, beans, nuts, and seeds. Some of these may be available in unpackaged forms or large amounts (in bulk). ? Fresh seafood. ? Poultry and eggs. ? Low-fat dairy products.  Buy whole ingredients instead of prepackaged foods.  Buy fresh fruits and vegetables in-season from local farmers markets.  Buy frozen fruits and vegetables in resealable bags.  If you do not have access to quality fresh seafood, buy precooked frozen shrimp or canned fish, such as tuna, salmon, or sardines.  Buy small amounts of raw or cooked vegetables, salads, or olives from the deli or salad bar at your store.  Stock your pantry so you always have certain foods on hand, such as olive oil, canned tuna, canned tomatoes, rice, pasta, and beans. Cooking  Cook foods with extra-virgin olive oil instead of using butter or other vegetable oils.  Have meat as a side dish, and have vegetables or grains as your main dish. This means having meat in small portions or adding small amounts of meat to foods like pasta or stew.  Use beans or vegetables instead of meat in common dishes like chili or lasagna.  Experiment with different cooking methods. Try roasting or broiling vegetables instead of steaming or sauteing them.  Add frozen vegetables to soups, stews, pasta, or rice.  Add nuts or seeds for added healthy fat at each meal. You can add these to yogurt, salads, or  vegetable dishes.  Marinate fish or vegetables using olive oil, lemon juice, garlic, and fresh herbs. Meal planning  Plan to eat 1 vegetarian meal one day each week. Try to work up to 2 vegetarian meals, if possible.  Eat seafood 2 or more times a week.  Have healthy snacks readily available, such as: ? Vegetable sticks with hummus. ? [place] yogurt. ? Fruit and nut trail mix.  Eat balanced meals throughout the week. This includes: ? Fruit: 2-3 servings a day ? Vegetables: 4-5 servings a day ? Low-fat dairy: 2 servings a day ? Fish, poultry, or lean meat: 1 serving a day ? Beans and legumes: 2 or more servings a week ? Nuts and seeds: 1-2 servings a day ? Whole grains: 6-8 servings a day ? Extra-virgin olive oil: 3-4 servings a day  Limit red meat and sweets to only a few servings a month What are my food choices?  Mediterranean diet ? Recommended ? Grains: Whole-grain pasta. Brown rice. Bulgar wheat. Polenta. Couscous. Whole-wheat bread. [place]. ? Vegetables: Artichokes. Beets. Broccoli. Cabbage. Carrots. Eggplant. Green beans. Chard. Kale. Spinach. Onions. Leeks. Peas. Squash. Tomatoes. Peppers. Radishes. ? Fruits: Apples. Apricots. Avocado. Berries. Bananas. Cherries. Dates. Figs. Grapes. Lemons. Melon. Oranges. Peaches. Plums. Pomegranate. ? Meats and other  protein foods: Beans. Almonds. Sunflower seeds. Pine nuts. Peanuts. [place]. Salmon. Scallops. Shrimp. [place]. Tilapia. Clams. Oysters. Eggs. ? Dairy: Low-fat milk. Cheese. Greek yogurt. ? Beverages: Water. Red wine. Herbal tea. ? Fats and oils: Extra virgin olive oil. Avocado oil. Grape seed oil. ? Sweets and desserts: [place] yogurt with honey. Baked apples. Poached pears. Trail mix. ? Seasoning and other foods: Basil. Cilantro. Coriander. Cumin. Mint. Parsley. Sage. Rosemary. Tarragon. Garlic. Oregano. Thyme. Pepper. Balsalmic vinegar. Tahini. Hummus. Tomato sauce. Olives. Mushrooms. ? Limit these ? Grains: Prepackaged  pasta or rice dishes. Prepackaged cereal with added sugar. ? Vegetables: Deep fried potatoes (french fries). ? Fruits: Fruit canned in syrup. ? Meats and other protein foods: Beef. Pork. Lamb. Poultry with skin. Hot dogs. [place] Salines. ? Dairy: Ice cream. Sour cream. Whole milk. ? Beverages: Juice. Sugar-sweetened soft drinks. Beer. Liquor and spirits. ? Fats and oils: Butter. Canola oil. Vegetable oil. Beef fat (tallow). Lard. ? Sweets and desserts: Cookies. Cakes. Pies. Candy. ? Seasoning and other foods: Mayonnaise. Premade sauces and marinades. ? The items listed may not be a complete list. Talk with your dietitian about what dietary choices are right for you. Summary  The Mediterranean diet includes both food and lifestyle choices.  Eat a variety of fresh fruits and vegetables, beans, nuts, seeds, and whole grains.  Limit the amount of red meat and sweets that you eat.  Talk with your health care provider about whether it is safe for you to drink red wine in moderation. This means 1 glass a day for nonpregnant women and 2 glasses a day for men. A glass of wine equals 5 oz (150 mL). This information is not intended to replace advice given to you by your health care provider. Make sure you discuss any questions you have with your health care provider. Document Released: 04/22/2016 Document Revised: 05/25/2016 Document Reviewed: 04/22/2016 Elsevier Interactive Patient Education  2018 [place] to drink plenty of water and follow heart healthy diet. Continue regular exercise. Overall your labs are quite good! Reduce saturated fat and sugar to help normalize LDL cholesterol and A1c. Recommend to repeat Low Dose CT in one year, re: smoking history. We will call you when PAP results are available. Recommend annual physical with fasting labs. NICE TO SEE YOU!

## 2018-07-03 NOTE — Assessment & Plan Note (Signed)
Continue to drink plenty of water and follow heart healthy diet. Continue regular exercise. Overall your labs are quite good! Reduce saturated fat and sugar to help normalize LDL cholesterol and A1c. Recommend to repeat Low Dose CT in one year, re: smoking history. We will call you when PAP results are available. Recommend annual physical with fasting labs.

## 2018-07-03 NOTE — Assessment & Plan Note (Signed)
04/03/18 LDCT IMPRESSION: 1. Lung-RADS 2, benign appearance or behavior. Continue annual screening with low-dose chest CT without contrast in 12 months. 2. Aortic atherosclerosis (ICD10-I70.0) and emphysema (ICD10-J43.9). Pt aware of results

## 2018-07-04 LAB — CYTOLOGY - PAP: DIAGNOSIS: NEGATIVE

## 2018-07-11 DIAGNOSIS — Z1211 Encounter for screening for malignant neoplasm of colon: Secondary | ICD-10-CM | POA: Diagnosis not present

## 2018-07-11 LAB — COLOGUARD: COLOGUARD: NEGATIVE

## 2018-07-18 ENCOUNTER — Telehealth: Payer: Self-pay

## 2018-07-18 NOTE — Telephone Encounter (Signed)
Pt informed of results.  Pt expressed understanding.  T. Nelson, CMA  

## 2018-07-18 NOTE — Telephone Encounter (Signed)
Cologuard results received, which were negative.  LVM for pt to call to discuss.  Erin Moses, CMA

## 2020-11-24 ENCOUNTER — Encounter: Payer: Self-pay | Admitting: Physician Assistant

## 2020-11-24 ENCOUNTER — Other Ambulatory Visit: Payer: Self-pay

## 2020-11-24 ENCOUNTER — Ambulatory Visit (INDEPENDENT_AMBULATORY_CARE_PROVIDER_SITE_OTHER): Payer: 59 | Admitting: Physician Assistant

## 2020-11-24 VITALS — BP 137/77 | HR 79 | Temp 97.8°F | Ht 63.0 in | Wt 193.8 lb

## 2020-11-24 DIAGNOSIS — G4700 Insomnia, unspecified: Secondary | ICD-10-CM | POA: Diagnosis not present

## 2020-11-24 DIAGNOSIS — E785 Hyperlipidemia, unspecified: Secondary | ICD-10-CM

## 2020-11-24 DIAGNOSIS — Z7689 Persons encountering health services in other specified circumstances: Secondary | ICD-10-CM | POA: Diagnosis not present

## 2020-11-24 DIAGNOSIS — Z0000 Encounter for general adult medical examination without abnormal findings: Secondary | ICD-10-CM | POA: Diagnosis not present

## 2020-11-24 NOTE — Progress Notes (Signed)
New Patient Office Visit  Subjective:  Patient ID: Erin Moses, female    DOB: 1956/07/23  Age: 65 y.o. MRN: [id number]  CC:  Chief Complaint  Patient presents with  . Establish Care    HPI Erin Moses presents to re-establish care. Has no acute concerns. No new medical or surgical history. Patient has been working from home so diet has not been great. Eating more snacks. Goes hiking twice weekly x 5 miles weather permitting. Reports good hydration, at least 64 fl oz per day. Drinks about 2 cups of coffee in the morning. Plans to retire this year and is hoping stress levels will decrease and be able to do more things, be more active and lose weight. Reports her brother passed away from CHF 2020-06-03. Takes diphenhydramine at bedtime to help fall and stay asleep. Sometimes takes melatonin which seems to not be as effective.   Past Medical History:  Diagnosis Date  . Cataract   . Chicken pox     Past Surgical History:  Procedure Laterality Date  . EYE SURGERY  2015   Cataract Surgery  . WISDOM TOOTH EXTRACTION      Family History  Problem Relation Age of Onset  . Hypertension Mother   . Alcohol abuse Father   . Diabetes Father   . Heart disease Paternal Grandfather     Social History   Socioeconomic History  . Marital status: Married    Spouse name: Not on file  . Number of children: Not on file  . Years of education: Not on file  . Highest education level: Not on file  Occupational History  . Not on file  Tobacco Use  . Smoking status: Former Smoker    Packs/day: 1.50    Years: 32.00    Pack years: 48.00    Types: Cigarettes    Quit date: 06/30/2007    Years since quitting: 13.4  . Smokeless tobacco: Never Used  Substance and Sexual Activity  . Alcohol use: Yes    Alcohol/week: 8.0 standard drinks    Types: 8 Cans of beer per week  . Drug use: No  . Sexual activity: Yes    Partners: Male    Birth control/protection: Post-menopausal    Comment:  Husband   Other Topics Concern  . Not on file  Social History Narrative   Married   Programme researcher, broadcasting/film/video- highest level of education   Caffeine- 3-4 cups of coffee in the morning    Children- 1    Social Determinants of Health   Financial Resource Strain: Not on file  Food Insecurity: Not on file  Transportation Needs: Not on file  Physical Activity: Not on file  Stress: Not on file  Social Connections: Not on file  Intimate Partner Violence: Not on file    ROS Review of Systems A fourteen system review of systems was performed and found to be positive as per HPI.  Objective:   Today's Vitals: BP 137/77   Pulse 79   Temp 97.8 F (36.6 C)   Ht 5\' 3"  (1.6 m)   Wt 193 lb 12.8 oz (87.9 kg)   SpO2 97%   BMI 34.33 kg/m   Physical Exam General:  Well Developed, well nourished, in no acute distress Neuro:  Alert and oriented,  extra-ocular muscles intact  HEENT:  Normocephalic, atraumatic, neck supple appreciated  Skin:  no gross rash, warm, pink. Cardiac:  RRR, S1 S2 wnl's, w/o murmur  Respiratory:  ECTA B/L  w/o wheezing, Not using accessory muscles, speaking in full sentences- unlabored. Vascular:  Ext warm, no cyanosis apprec.; cap RF less 2 sec. Psych:  No HI/SI, judgement and insight good, Euthymic mood. Full Affect.   Assessment & Plan:   Problem List Items Addressed This Visit      Other   Encounter to establish care - Primary    Other Visit Diagnoses    Insomnia, unspecified type       Hyperlipidemia, unspecified hyperlipidemia type         Encounter to establish care, Healthcare Maintenance: -Continue good hydration. -Recommend to follow a heart healthy diet and reduce saturated and trans fats. -Continue to stay active, ultimate goal of 150 mins/wk. -Vital signs stable -Continue good hydration. -Schedule CPE and FBW.   Hyperlipidemia: -Last lipid panel 2019: total cholesterol 202, triglycerides 167, HDL 48, LDL 121 -Recommend to follow a  heart health diet, reduce saturated and trans fats. -Continue to stay active. -Will repeat lipid panel with CPE.  Insomnia, unspecified type: -Good sleep hygiene discussed and recommend to reduce caffeine use. -Recommend to try melatonin time release.   Outpatient Encounter Medications as of 11/24/2020  Medication Sig  . aspirin 325 MG tablet Take 1 tablet by mouth daily.  . diphenhydrAMINE (BENADRYL) 25 mg capsule Take 25 mg by mouth at bedtime. Take one or two capsules at bedtime as needed  . Melatonin 10 MG TBCR Take 1 tablet by mouth at bedtime as needed.  . Multiple Vitamin (MULTI VITAMIN DAILY PO) Take 1 tablet by mouth daily.   No facility-administered encounter medications on file as of 11/24/2020.    Follow-up: Return for CPE and FBW 1 wk prior next available .   Note:  This note was prepared with assistance of Dragon voice recognition software. Occasional wrong-word or sound-a-like substitutions may have occurred due to the inherent limitations of voice recognition software.   Mayer Masker, PA-C

## 2020-11-24 NOTE — Patient Instructions (Signed)

## 2020-11-28 ENCOUNTER — Other Ambulatory Visit: Payer: Self-pay | Admitting: Physician Assistant

## 2020-11-28 DIAGNOSIS — Z0000 Encounter for general adult medical examination without abnormal findings: Secondary | ICD-10-CM

## 2020-11-28 DIAGNOSIS — E785 Hyperlipidemia, unspecified: Secondary | ICD-10-CM

## 2020-12-01 ENCOUNTER — Other Ambulatory Visit: Payer: Self-pay

## 2020-12-01 ENCOUNTER — Other Ambulatory Visit: Payer: 59

## 2020-12-01 DIAGNOSIS — E785 Hyperlipidemia, unspecified: Secondary | ICD-10-CM

## 2020-12-01 DIAGNOSIS — Z0000 Encounter for general adult medical examination without abnormal findings: Secondary | ICD-10-CM

## 2020-12-02 DIAGNOSIS — R7989 Other specified abnormal findings of blood chemistry: Secondary | ICD-10-CM | POA: Insufficient documentation

## 2020-12-02 LAB — COMPREHENSIVE METABOLIC PANEL
ALT: 25 IU/L (ref 0–32)
AST: 23 IU/L (ref 0–40)
Albumin/Globulin Ratio: 1.6 (ref 1.2–2.2)
Albumin: 4.1 g/dL (ref 3.8–4.8)
Alkaline Phosphatase: 107 IU/L (ref 44–121)
BUN/Creatinine Ratio: 17 (ref 12–28)
BUN: 12 mg/dL (ref 8–27)
Bilirubin Total: 0.6 mg/dL (ref 0.0–1.2)
CO2: 21 mmol/L (ref 20–29)
Calcium: 9.4 mg/dL (ref 8.7–10.3)
Chloride: 100 mmol/L (ref 96–106)
Creatinine, Ser: 0.72 mg/dL (ref 0.57–1.00)
Globulin, Total: 2.5 g/dL (ref 1.5–4.5)
Glucose: 135 mg/dL — ABNORMAL HIGH (ref 65–99)
Potassium: 4.3 mmol/L (ref 3.5–5.2)
Sodium: 140 mmol/L (ref 134–144)
Total Protein: 6.6 g/dL (ref 6.0–8.5)
eGFR: 93 mL/min/{1.73_m2} (ref >59)

## 2020-12-02 LAB — HEMOGLOBIN A1C
Est. average glucose Bld gHb Est-mCnc: 137 mg/dL
Hgb A1c MFr Bld: 6.4 % — ABNORMAL HIGH (ref 4.8–5.6)

## 2020-12-02 LAB — LIPID PANEL
Chol/HDL Ratio: 3.9 ratio (ref 0.0–4.4)
Cholesterol, Total: 192 mg/dL (ref 100–199)
HDL: 49 mg/dL (ref >39)
LDL Chol Calc (NIH): 105 mg/dL — ABNORMAL HIGH (ref 0–99)
Triglycerides: 219 mg/dL — ABNORMAL HIGH (ref 0–149)
VLDL Cholesterol Cal: 38 mg/dL (ref 5–40)

## 2020-12-02 LAB — CBC
Hematocrit: 40.9 % (ref 34.0–46.6)
Hemoglobin: 13.3 g/dL (ref 11.1–15.9)
MCH: 28.1 pg (ref 26.6–33.0)
MCHC: 32.5 g/dL (ref 31.5–35.7)
MCV: 86 fL (ref 79–97)
Platelets: 367 10*3/uL (ref 150–450)
RBC: 4.74 x10E6/uL (ref 3.77–5.28)
RDW: 12.4 % (ref 11.7–15.4)
WBC: 6.5 10*3/uL (ref 3.4–10.8)

## 2020-12-02 LAB — TSH: TSH: 5 u[IU]/mL — ABNORMAL HIGH (ref 0.450–4.500)

## 2020-12-03 LAB — SPECIMEN STATUS REPORT

## 2020-12-03 LAB — T3: T3, Total: 124 ng/dL (ref 71–180)

## 2020-12-03 LAB — T4, FREE: Free T4: 0.99 ng/dL (ref 0.82–1.77)

## 2020-12-04 ENCOUNTER — Other Ambulatory Visit: Payer: Self-pay

## 2020-12-04 ENCOUNTER — Encounter: Payer: Self-pay | Admitting: Physician Assistant

## 2020-12-04 ENCOUNTER — Ambulatory Visit (INDEPENDENT_AMBULATORY_CARE_PROVIDER_SITE_OTHER): Payer: 59 | Admitting: Physician Assistant

## 2020-12-04 VITALS — BP 122/80 | HR 100 | Temp 97.0°F | Ht 62.0 in | Wt 193.4 lb

## 2020-12-04 DIAGNOSIS — Z23 Encounter for immunization: Secondary | ICD-10-CM

## 2020-12-04 DIAGNOSIS — E785 Hyperlipidemia, unspecified: Secondary | ICD-10-CM

## 2020-12-04 DIAGNOSIS — Z1231 Encounter for screening mammogram for malignant neoplasm of breast: Secondary | ICD-10-CM

## 2020-12-04 DIAGNOSIS — Z1211 Encounter for screening for malignant neoplasm of colon: Secondary | ICD-10-CM

## 2020-12-04 DIAGNOSIS — Z0000 Encounter for general adult medical examination without abnormal findings: Secondary | ICD-10-CM | POA: Diagnosis not present

## 2020-12-04 DIAGNOSIS — R7303 Prediabetes: Secondary | ICD-10-CM | POA: Diagnosis not present

## 2020-12-04 NOTE — Progress Notes (Signed)
Subjective:     Erin Moses is a 65 y.o. female and is here for a comprehensive physical exam. The patient reports no problems.  Social History   Socioeconomic History  . Marital status: Married    Spouse name: Not on file  . Number of children: Not on file  . Years of education: Not on file  . Highest education level: Not on file  Occupational History  . Not on file  Tobacco Use  . Smoking status: Former Smoker    Packs/day: 1.50    Years: 32.00    Pack years: 48.00    Types: Cigarettes    Quit date: 06/30/2007    Years since quitting: 13.4  . Smokeless tobacco: Never Used  Substance and Sexual Activity  . Alcohol use: Yes    Alcohol/week: 8.0 standard drinks    Types: 8 Cans of beer per week  . Drug use: No  . Sexual activity: Yes    Partners: Male    Birth control/protection: Post-menopausal    Comment: Husband   Other Topics Concern  . Not on file  Social History Narrative   Married   Programme researcher, broadcasting/film/video- highest level of education   Caffeine- 3-4 cups of coffee in the morning    Children- 1    Social Determinants of Health   Financial Resource Strain: Not on file  Food Insecurity: Not on file  Transportation Needs: Not on file  Physical Activity: Not on file  Stress: Not on file  Social Connections: Not on file  Intimate Partner Violence: Not on file   Health Maintenance  Topic Date Due  . COLONOSCOPY (Pts 45-78yrs Insurance coverage will need to be confirmed)  Never done  . TETANUS/TDAP  02/11/2020  . MAMMOGRAM  04/07/2020  . INFLUENZA VACCINE  12/11/2020 (Originally 04/13/2020)  . COVID-19 Vaccine (4 - Booster) 02/25/2021  . PAP SMEAR-Modifier  07/03/2021  . Hepatitis C Screening  Completed  . HIV Screening  Completed  . HPV VACCINES  Aged Out    The following portions of the patient's history were reviewed and updated as appropriate: allergies, current medications, past family history, past medical history, past social history, past  surgical history and problem list.  Review of Systems Pertinent items noted in HPI and remainder of comprehensive ROS otherwise negative.   Objective:    BP 122/80   Pulse 100   Temp (!) 97 F (36.1 C) (Temporal)   Ht 5\' 2"  (1.575 m)   Wt 193 lb 6.4 oz (87.7 kg)   SpO2 97%   BMI 35.37 kg/m  General appearance: alert, cooperative and no distress Head: Normocephalic, without obvious abnormality, atraumatic Eyes: conjunctivae/corneas clear. PERRL, EOM's intact. Fundi benign. Ears: normal TM's and external ear canals both ears Nose: Nares normal. Septum midline. Mucosa normal. No drainage or sinus tenderness. Throat: lips, mucosa, and tongue normal; teeth and gums normal Neck: no adenopathy, no JVD, supple, symmetrical, trachea midline and thyroid not enlarged, symmetric, no tenderness/mass/nodules Back: symmetric, no curvature. ROM normal. No CVA tenderness. Lungs: clear to auscultation bilaterally Breasts: Deferred by pt. Heart: regular rate and rhythm, S1, S2 normal, no murmur, click, rub or gallop Abdomen: soft, non-tender; bowel sounds normal; no masses,  no organomegaly Pelvic: not indicated; post-menopausal, no abnormal Pap smears in past Extremities: no edema, redness or tenderness in the calves or thighs, no ulcers, gangrene or trophic changes and varicose veins noted Pulses: 2+ and symmetric Skin: Skin color, texture, turgor normal. No rashes or  lesions Lymph nodes: Cervical adenopathy: normal and Supraclavicular adenopathy: normal Neurologic: Grossly normal    Assessment:    Healthy female exam.     Plan:  -Discussed with patient most recent lab results which are essentially within normal limits or stable from prior with the exception of elevated A1c. Recommend to reduce carbohydrates and glucose, increase physical activity and weight loss. Recommend to reduce saturated and trans fats to improve LDL. -Continue to stay well hydrated. -Patient agreeable to cologuard,  mammogram, and Tdap vaccine. UTD on Shingrix, Hep C and HIV screenings. -Follow up in 6 months for HLD, PreDM and FBW (lipid panel, cmp, a1c)  See After Visit Summary for Counseling Recommendations

## 2020-12-04 NOTE — Patient Instructions (Signed)
Diabetes Care, 44(Suppl 1), S34-S39. [email or web address]">  Prediabetes Prediabetes is when your blood sugar (blood glucose) level is higher than normal but not high enough for you to be diagnosed with type 2 diabetes. Having prediabetes puts you at risk for developing type 2 diabetes (type 2 diabetes mellitus). With certain lifestyle changes, you may be able to prevent or delay the onset of type 2 diabetes. This is important because type 2 diabetes can lead to serious complications, such as:  Heart disease.  Stroke.  Blindness.  Kidney disease.  Depression.  Poor circulation in the feet and legs. In severe cases, this could lead to surgical removal of a leg (amputation). What are the causes? The exact cause of prediabetes is not known. It may result from insulin resistance. Insulin resistance develops when cells in the body do not respond properly to insulin that the body makes. This can cause excess glucose to build up in the blood. High blood glucose (hyperglycemia) can develop. What increases the risk? The following factors may make you more likely to develop this condition:  You have a family member with type 2 diabetes.  You are older than 45 years.  You had a temporary form of diabetes during a pregnancy (gestational diabetes).  You had polycystic ovary syndrome (PCOS).  You are overweight or obese.  You are inactive (sedentary).  You have a history of heart disease, including problems with cholesterol levels, high levels of blood fats, or high blood pressure. What are the signs or symptoms? You may have no symptoms. If you do have symptoms, they may include:  Increased hunger.  Increased thirst.  Increased urination.  Vision changes, such as blurry vision.  Tiredness (fatigue). How is this diagnosed? This condition can be diagnosed with blood tests. Your blood glucose may be checked with one or more of the following tests:  A  fasting blood glucose (FBG) test. You will not be allowed to eat (you will fast) for at least 8 hours before a blood sample is taken.  An A1C blood test (hemoglobin A1C). This test provides information about blood glucose levels over the previous 2?3 months.  An oral glucose tolerance test (OGTT). This test measures your blood glucose at two points in time: ? After fasting. This is your baseline level. ? Two hours after you drink a beverage that contains glucose. You may be diagnosed with prediabetes if:  Your FBG is 100?125 mg/dL (5.6-6.9 mmol/L).  Your A1C level is 5.7?6.4% (39-46 mmol/mol).  Your OGTT result is 140?199 mg/dL (7.8-11 mmol/L). These blood tests may be repeated to confirm your diagnosis.   How is this treated? Treatment may include dietary and lifestyle changes to help lower your blood glucose and prevent type 2 diabetes from developing. In some cases, medicine may be prescribed to help lower the risk of type 2 diabetes. Follow these instructions at home: Nutrition  Follow a healthy meal plan. This includes eating lean proteins, whole grains, legumes, fresh fruits and vegetables, low-fat dairy products, and healthy fats.  Follow instructions from your health care provider about eating or drinking restrictions.  Meet with a dietitian to create a healthy eating plan that is right for you.   Lifestyle  Do moderate-intensity exercise for at least 30 minutes a day on 5 or more days each week, or as told by your health care provider. A mix of activities may be best, such as: ? Brisk walking, swimming, biking, and weight lifting.  Lose weight as told by your health   care provider. Losing 5-7% of your body weight can reverse insulin resistance.  Do not drink alcohol if: ? Your health care provider tells you not to drink. ? You are pregnant, may be pregnant, or are planning to become pregnant.  If you drink alcohol: ? Limit how much you use to:  0-1 drink a day for  women.  0-2 drinks a day for men. ? Be aware of how much alcohol is in your drink. In the [place] one drink equals one 12 oz bottle of beer (355 mL), one 5 oz glass of wine (148 mL), or one 1 oz glass of hard liquor (44 mL). General instructions  Take over-the-counter and prescription medicines only as told by your health care provider. You may be prescribed medicines that help lower the risk of type 2 diabetes.  Do not use any products that contain nicotine or tobacco, such as cigarettes, e-cigarettes, and chewing tobacco. If you need help quitting, ask your health care provider.  Keep all follow-up visits. This is important. Where to find more information  American Diabetes Association: www.diabetes.org  Academy of Nutrition and Dietetics: www.eatright.org  American Heart Association: www.heart.org Contact a health care provider if:  You have any of these symptoms: ? Increased hunger. ? Increased urination. ? Increased thirst. ? Fatigue. ? Vision changes, such as blurry vision. Get help right away if you:  Have shortness of breath.  Feel confused.  Vomit or feel like you may vomit. Summary  Prediabetes is when your blood sugar (blood glucose)level is higher than normal but not high enough for you to be diagnosed with type 2 diabetes.  Having prediabetes puts you at risk for developing type 2 diabetes (type 2 diabetes mellitus).  Make lifestyle changes such as eating a healthy diet and exercising regularly to help prevent diabetes. Lose weight as told by your health care provider. This information is not intended to replace advice given to you by your health care provider. Make sure you discuss any questions you have with your health care provider. Document Revised: 11/29/2019 Document Reviewed: 11/29/2019 Elsevier Patient Education  2021 Elsevier Inc.  

## 2021-05-29 ENCOUNTER — Other Ambulatory Visit: Payer: Self-pay

## 2021-05-29 ENCOUNTER — Ambulatory Visit
Admission: RE | Admit: 2021-05-29 | Discharge: 2021-05-29 | Disposition: A | Discharge status: Home / Self Care | Payer: Medicare Other | Source: Ambulatory Visit | Attending: Physician Assistant | Admitting: Physician Assistant

## 2021-05-29 DIAGNOSIS — Z1231 Encounter for screening mammogram for malignant neoplasm of breast: Secondary | ICD-10-CM

## 2021-06-01 ENCOUNTER — Other Ambulatory Visit: Payer: Self-pay

## 2021-06-01 ENCOUNTER — Other Ambulatory Visit: Payer: Medicare Other

## 2021-06-01 DIAGNOSIS — R7303 Prediabetes: Secondary | ICD-10-CM

## 2021-06-01 DIAGNOSIS — E785 Hyperlipidemia, unspecified: Secondary | ICD-10-CM

## 2021-06-02 LAB — COMPREHENSIVE METABOLIC PANEL
ALT: 35 IU/L — ABNORMAL HIGH (ref 0–32)
AST: 22 IU/L (ref 0–40)
Albumin/Globulin Ratio: 1.6 (ref 1.2–2.2)
Albumin: 4.2 g/dL (ref 3.8–4.8)
Alkaline Phosphatase: 92 IU/L (ref 44–121)
BUN/Creatinine Ratio: 14 (ref 12–28)
BUN: 11 mg/dL (ref 8–27)
Bilirubin Total: 0.5 mg/dL (ref 0.0–1.2)
CO2: 23 mmol/L (ref 20–29)
Calcium: 9.4 mg/dL (ref 8.7–10.3)
Chloride: 101 mmol/L (ref 96–106)
Creatinine, Ser: 0.76 mg/dL (ref 0.57–1.00)
Globulin, Total: 2.6 g/dL (ref 1.5–4.5)
Glucose: 128 mg/dL — ABNORMAL HIGH (ref 65–99)
Potassium: 4.5 mmol/L (ref 3.5–5.2)
Sodium: 139 mmol/L (ref 134–144)
Total Protein: 6.8 g/dL (ref 6.0–8.5)
eGFR: 87 mL/min/{1.73_m2} (ref >59)

## 2021-06-02 LAB — LIPID PANEL
Chol/HDL Ratio: 3.9 ratio (ref 0.0–4.4)
Cholesterol, Total: 229 mg/dL — ABNORMAL HIGH (ref 100–199)
HDL: 59 mg/dL (ref >39)
LDL Chol Calc (NIH): 134 mg/dL — ABNORMAL HIGH (ref 0–99)
Triglycerides: 202 mg/dL — ABNORMAL HIGH (ref 0–149)
VLDL Cholesterol Cal: 36 mg/dL (ref 5–40)

## 2021-06-02 LAB — HEMOGLOBIN A1C
Est. average glucose Bld gHb Est-mCnc: 131 mg/dL
Hgb A1c MFr Bld: 6.2 % — ABNORMAL HIGH (ref 4.8–5.6)

## 2021-06-04 ENCOUNTER — Encounter: Payer: Self-pay | Admitting: Physician Assistant

## 2021-06-04 ENCOUNTER — Other Ambulatory Visit: Payer: Self-pay

## 2021-06-04 ENCOUNTER — Ambulatory Visit (INDEPENDENT_AMBULATORY_CARE_PROVIDER_SITE_OTHER): Payer: Medicare Other | Admitting: Physician Assistant

## 2021-06-04 VITALS — BP 134/82 | HR 95 | Temp 98.7°F | Ht 62.0 in | Wt 192.0 lb

## 2021-06-04 DIAGNOSIS — E785 Hyperlipidemia, unspecified: Secondary | ICD-10-CM

## 2021-06-04 DIAGNOSIS — R7303 Prediabetes: Secondary | ICD-10-CM | POA: Diagnosis not present

## 2021-06-04 NOTE — Patient Instructions (Addendum)
Heart-Healthy Eating Plan °Heart-healthy meal planning includes: °Eating less unhealthy fats. °Eating more healthy fats. °Making other changes in your diet. °Talk with your doctor or a diet specialist (dietitian) to create an eating plan that is right for you. °What is my plan? °Your doctor may recommend an eating plan that includes: °Total fat: ______% or less of total calories a day. °Saturated fat: ______% or less of total calories a day. °Cholesterol: less than _________mg a day. °What are tips for following this plan? °Cooking °Avoid frying your food. Try to bake, boil, grill, or broil it instead. You can also reduce fat by: °Removing the skin from poultry. °Removing all visible fats from meats. °Steaming vegetables in water or broth. °Meal planning ° °At meals, divide your plate into four equal parts: °Fill one-half of your plate with vegetables and green salads. °Fill one-fourth of your plate with whole grains. °Fill one-fourth of your plate with lean protein foods. °Eat 4-5 servings of vegetables per day. A serving of vegetables is: °1 cup of raw or cooked vegetables. °2 cups of raw leafy greens. °Eat 4-5 servings of fruit per day. A serving of fruit is: °1 medium whole fruit. °¼ cup of dried fruit. °½ cup of fresh, frozen, or canned fruit. °½ cup of 100% fruit juice. °Eat more foods that have soluble fiber. These are apples, broccoli, carrots, beans, peas, and barley. Try to get 20-30 g of fiber per day. °Eat 4-5 servings of nuts, legumes, and seeds per week: °1 serving of dried beans or legumes equals ½ cup after being cooked. °1 serving of nuts is ¼ cup. °1 serving of seeds equals 1 tablespoon. °General information °Eat more home-cooked food. Eat less restaurant, buffet, and fast food. °Limit or avoid alcohol. °Limit foods that are high in starch and sugar. °Avoid fried foods. °Lose weight if you are overweight. °Keep track of how much salt (sodium) you eat. This is important if you have high blood  pressure. Ask your doctor to tell you more about this. °Try to add vegetarian meals each week. °Fats °Choose healthy fats. These include olive oil and canola oil, flaxseeds, walnuts, almonds, and seeds. °Eat more omega-3 fats. These include salmon, mackerel, sardines, tuna, flaxseed oil, and ground flaxseeds. Try to eat fish at least 2 times each week. °Check food labels. Avoid foods with trans fats or high amounts of saturated fat. °Limit saturated fats. °These are often found in animal products, such as meats, butter, and cream. °These are also found in plant foods, such as palm oil, palm kernel oil, and coconut oil. °Avoid foods with partially hydrogenated oils in them. These have trans fats. Examples are stick margarine, some tub margarines, cookies, crackers, and other baked goods. °What foods can I eat? °Fruits °All fresh, canned (in natural juice), or frozen fruits. °Vegetables °Fresh or frozen vegetables (raw, steamed, roasted, or grilled). Green salads. °Grains °Most grains. Choose whole wheat and whole grains most of the time. Rice and pasta, including brown rice and pastas made with whole wheat. °Meats and other proteins °Lean, well-trimmed beef, veal, pork, and lamb. Chicken and turkey without skin. All fish and shellfish. Wild duck, rabbit, pheasant, and venison. Egg whites or low-cholesterol egg substitutes. Dried beans, peas, lentils, and tofu. Seeds and most nuts. °Dairy °Low-fat or nonfat cheeses, including ricotta and mozzarella. Skim or 1% milk that is liquid, powdered, or evaporated. Buttermilk that is made with low-fat milk. Nonfat or low-fat yogurt. °Fats and oils °Non-hydrogenated (trans-free) margarines. Vegetable oils, including   soybean, sesame, sunflower, olive, peanut, safflower, corn, canola, and cottonseed. Salad dressings or mayonnaise made with a vegetable oil. °Beverages °Mineral water. Coffee and tea. Diet carbonated beverages. °Sweets and desserts °Sherbet, gelatin, and fruit ice.  Small amounts of dark chocolate. °Limit all sweets and desserts. °Seasonings and condiments °All seasonings and condiments. °The items listed above may not be a complete list of foods and drinks you can eat. Contact a dietitian for more options. °What foods should I avoid? °Fruits °Canned fruit in heavy syrup. Fruit in cream or butter sauce. Fried fruit. Limit coconut. °Vegetables °Vegetables cooked in cheese, cream, or butter sauce. Fried vegetables. °Grains °Breads that are made with saturated or trans fats, oils, or whole milk. Croissants. Sweet rolls. Donuts. High-fat crackers, such as cheese crackers. °Meats and other proteins °Fatty meats, such as hot dogs, ribs, sausage, bacon, rib-eye roast or steak. High-fat deli meats, such as salami and bologna. Caviar. Domestic duck and goose. Organ meats, such as liver. °Dairy °Cream, sour cream, cream cheese, and creamed cottage cheese. Whole-milk cheeses. Whole or 2% milk that is liquid, evaporated, or condensed. Whole buttermilk. Cream sauce or high-fat cheese sauce. Yogurt that is made from whole milk. °Fats and oils °Meat fat, or shortening. Cocoa butter, hydrogenated oils, palm oil, coconut oil, palm kernel oil. Solid fats and shortenings, including bacon fat, salt pork, lard, and butter. Nondairy cream substitutes. Salad dressings with cheese or sour cream. °Beverages °Regular sodas and juice drinks with added sugar. °Sweets and desserts °Frosting. Pudding. Cookies. Cakes. Pies. Milk chocolate or white chocolate. Buttered syrups. Full-fat ice cream or ice cream drinks. °The items listed above may not be a complete list of foods and drinks to avoid. Contact a dietitian for more information. °Summary °Heart-healthy meal planning includes eating less unhealthy fats, eating more healthy fats, and making other changes in your diet. °Eat a balanced diet. This includes fruits and vegetables, low-fat or nonfat dairy, lean protein, nuts and legumes, whole grains, and  heart-healthy oils and fats. °This information is not intended to replace advice given to you by your health care provider. Make sure you discuss any questions you have with your health care provider. °Document Revised: 01/08/2021 Document Reviewed: 01/08/2021 °Elsevier Patient Education © 2022 [employer] Inc. ° ° °Prediabetes °Prediabetes is when your blood sugar (blood glucose) level is higher than normal but not high enough for you to be diagnosed with type 2 diabetes. Having prediabetes puts you at risk for developing type 2 diabetes (type 2 diabetes mellitus). °With certain lifestyle changes, you may be able to prevent or delay the onset of type 2 diabetes. This is important because type 2 diabetes can lead to serious complications, such as: °Heart disease. °Stroke. °Blindness. °Kidney disease. °Depression. °Poor circulation in the feet and legs. In severe cases, this could lead to surgical removal of a leg (amputation). °What are the causes? °The exact cause of prediabetes is not known. It may result from insulin resistance. Insulin resistance develops when cells in the body do not respond properly to insulin that the body makes. This can cause excess glucose to build up in the blood. High blood glucose (hyperglycemia) can develop. °What increases the risk? °The following factors may make you more likely to develop this condition: °You have a family member with type 2 diabetes. °You are older than 45 years. °You had a temporary form of diabetes during a pregnancy (gestational diabetes). °You had polycystic ovary syndrome (PCOS). °You are overweight or obese. °You are inactive (sedentary). °  You have a history of heart disease, including problems with cholesterol levels, high levels of blood fats, or high blood pressure. °What are the signs or symptoms? °You may have no symptoms. If you do have symptoms, they may include: °Increased hunger. °Increased thirst. °Increased urination. °Vision changes, such as blurry  vision. °Tiredness (fatigue). °How is this diagnosed? °This condition can be diagnosed with blood tests. Your blood glucose may be checked with one or more of the following tests: °A fasting blood glucose (FBG) test. You will not be allowed to eat (you will fast) for at least 8 hours before a blood sample is taken. °An A1C blood test (hemoglobin A1C). This test provides information about blood glucose levels over the previous 2?3 months. °An oral glucose tolerance test (OGTT). This test measures your blood glucose at two points in time: °After fasting. This is your baseline level. °Two hours after you drink a beverage that contains glucose. °You may be diagnosed with prediabetes if: °Your FBG is 100?125 mg/dL (5.6-6.9 mmol/L). °Your A1C level is 5.7?6.4% (39-46 mmol/mol). °Your OGTT result is 140?199 mg/dL (7.8-11 mmol/L). °These blood tests may be repeated to confirm your diagnosis. °How is this treated? °Treatment may include dietary and lifestyle changes to help lower your blood glucose and prevent type 2 diabetes from developing. In some cases, medicine may be prescribed to help lower the risk of type 2 diabetes. °Follow these instructions at home: °Nutrition ° °Follow a healthy meal plan. This includes eating lean proteins, whole grains, legumes, fresh fruits and vegetables, low-fat dairy products, and healthy fats. °Follow instructions from your health care provider about eating or drinking restrictions. °Meet with a dietitian to create a healthy eating plan that is right for you. °Lifestyle °Do moderate-intensity exercise for at least 30 minutes a day on 5 or more days each week, or as told by your health care provider. A mix of activities may be best, such as: °Brisk walking, swimming, biking, and weight lifting. °Lose weight as told by your health care provider. Losing 5-7% of your body weight can reverse insulin resistance. °Do not drink alcohol if: °Your health care provider tells you not to drink. °You  are pregnant, may be pregnant, or are planning to become pregnant. °If you drink alcohol: °Limit how much you use to: °0-1 drink a day for women. °0-2 drinks a day for men. °Be aware of how much alcohol is in your drink. In the [place] one drink equals one 12 oz bottle of beer (355 mL), one 5 oz glass of wine (148 mL), or one 1½ oz glass of hard liquor (44 mL). °General instructions °Take over-the-counter and prescription medicines only as told by your health care provider. You may be prescribed medicines that help lower the risk of type 2 diabetes. °Do not use any products that contain nicotine or tobacco, such as cigarettes, e-cigarettes, and chewing tobacco. If you need help quitting, ask your health care provider. °Keep all follow-up visits. This is important. °Where to find more information °American Diabetes Association: www.diabetes.org °Academy of Nutrition and Dietetics: www.eatright.org °American Heart Association: www.heart.org °Contact a health care provider if: °You have any of these symptoms: °Increased hunger. °Increased urination. °Increased thirst. °Fatigue. °Vision changes, such as blurry vision. °Get help right away if you: °Have shortness of breath. °Feel confused. °Vomit or feel like you may vomit. °Summary °Prediabetes is when your blood sugar (blood glucose)level is higher than normal but not high enough for you to be diagnosed with type 2   diabetes. °Having prediabetes puts you at risk for developing type 2 diabetes (type 2 diabetes mellitus). °Make lifestyle changes such as eating a healthy diet and exercising regularly to help prevent diabetes. Lose weight as told by your health care provider. °This information is not intended to replace advice given to you by your health care provider. Make sure you discuss any questions you have with your health care provider. °Document Revised: 11/29/2019 Document Reviewed: 11/29/2019 °Elsevier Patient Education © 2022 [employer]. ° °

## 2021-06-04 NOTE — Progress Notes (Signed)
Established Patient Office Visit  Subjective:  Patient ID: Erin Moses, female    DOB: 12-16-1955  Age: 65 y.o. MRN: [id number]  CC:  Chief Complaint  Patient presents with   Follow-up    PreDM   Hyperlipidemia    HPI Erin Moses presents for follow up on hyperlipidemia and prediabetes. Has no acute concerns. Patient reports has not been as good with her diet since she retired and has not been walking. Has been eating out including fried foods. States has reduced cookies and carbohydrates. No increased thirst, chest pain, palpitations, shortness of breath or dizziness.  Past Medical History:  Diagnosis Date   Cataract    Chicken pox     Past Surgical History:  Procedure Laterality Date   EYE SURGERY  2015   Cataract Surgery   WISDOM TOOTH EXTRACTION      Family History  Problem Relation Age of Onset   Hypertension Mother    Alcohol abuse Father    Diabetes Father    Heart disease Paternal Grandfather     Social History   Socioeconomic History   Marital status: Married    Spouse name: Not on file   Number of children: Not on file   Years of education: Not on file   Highest education level: Not on file  Occupational History   Not on file  Tobacco Use   Smoking status: Former    Packs/day: 1.50    Years: 32.00    Pack years: 48.00    Types: Cigarettes    Quit date: 06/30/2007    Years since quitting: 13.9   Smokeless tobacco: Never  Substance and Sexual Activity   Alcohol use: Yes    Alcohol/week: 8.0 standard drinks    Types: 8 Cans of beer per week   Drug use: No   Sexual activity: Yes    Partners: Male    Birth control/protection: Post-menopausal    Comment: Husband   Other Topics Concern   Not on file  Social History Narrative   Married   Veterinary surgeon- highest level of education   Caffeine- 3-4 cups of coffee in the morning    Children- 1    Social Determinants of Health   Financial Resource Strain: Not on file  Food  Insecurity: Not on file  Transportation Needs: Not on file  Physical Activity: Not on file  Stress: Not on file  Social Connections: Not on file  Intimate Partner Violence: Not on file    Outpatient Medications Prior to Visit  Medication Sig Dispense Refill   aspirin 325 MG tablet Take 1 tablet by mouth daily.     diphenhydrAMINE (BENADRYL) 25 mg capsule Take 25 mg by mouth at bedtime. Take one or two capsules at bedtime as needed     Melatonin 10 MG TBCR Take 1 tablet by mouth at bedtime as needed.     Multiple Vitamin (MULTI VITAMIN DAILY PO) Take 1 tablet by mouth daily.     No facility-administered medications prior to visit.    No Known Allergies  ROS Review of Systems Review of Systems:  A fourteen system review of systems was performed and found to be positive as per HPI.  Objective:    Physical Exam General:  Well Developed, well nourished, appropriate for stated age.  Neuro:  Alert and oriented,  extra-ocular muscles intact  HEENT:  Normocephalic, atraumatic, neck supple Skin:  no gross rash, warm, pink. Cardiac:  RRR, S1 S2, no murmur  Respiratory:  CTA B/L, Not using accessory muscles, speaking in full sentences- unlabored. Vascular:  Ext warm, no cyanosis apprec.; cap RF less 2 sec. Psych:  No HI/SI, judgement and insight good, Euthymic mood. Full Affect.  BP 134/82   Pulse 95   Temp 98.7 F (37.1 C)   Ht '5\' 2"'  (1.575 m)   Wt 192 lb (87.1 kg)   SpO2 95%   BMI 35.12 kg/m  Wt Readings from Last 3 Encounters:  06/04/21 192 lb (87.1 kg)  12/04/20 193 lb 6.4 oz (87.7 kg)  11/24/20 193 lb 12.8 oz (87.9 kg)     Health Maintenance Due  Topic Date Due   COLONOSCOPY (Pts 45-51yr Insurance coverage will need to be confirmed)  Never done   Zoster Vaccines- Shingrix (2 of 2) 05/16/2018   MAMMOGRAM  04/07/2020   COVID-19 Vaccine (4 - Booster) 12/26/2020   DEXA SCAN  Never done   INFLUENZA VACCINE  Never done   PAP SMEAR-Modifier  07/03/2021    There are  no preventive care reminders to display for this patient.  Lab Results  Component Value Date   TSH 5.000 (H) 12/01/2020   Lab Results  Component Value Date   WBC 6.5 12/01/2020   HGB 13.3 12/01/2020   HCT 40.9 12/01/2020   MCV 86 12/01/2020   PLT 367 12/01/2020   Lab Results  Component Value Date   NA 139 06/01/2021   K 4.5 06/01/2021   CO2 23 06/01/2021   GLUCOSE 128 (H) 06/01/2021   BUN 11 06/01/2021   CREATININE 0.76 06/01/2021   BILITOT 0.5 06/01/2021   ALKPHOS 92 06/01/2021   AST 22 06/01/2021   ALT 35 (H) 06/01/2021   PROT 6.8 06/01/2021   ALBUMIN 4.2 06/01/2021   CALCIUM 9.4 06/01/2021   EGFR 87 06/01/2021   GFR 76.83 07/23/2015   Lab Results  Component Value Date   CHOL 229 (H) 06/01/2021   Lab Results  Component Value Date   HDL 59 06/01/2021   Lab Results  Component Value Date   LDLCALC 134 (H) 06/01/2021   Lab Results  Component Value Date   TRIG 202 (H) 06/01/2021   Lab Results  Component Value Date   CHOLHDL 3.9 06/01/2021   Lab Results  Component Value Date   HGBA1C 6.2 (H) 06/01/2021      Assessment & Plan:   Problem List Items Addressed This Visit   None Visit Diagnoses     Hyperlipidemia, unspecified hyperlipidemia type    -  Primary   Prediabetes          Hyperlipidemia: -Discussed with patient recent lipid panel. Patient will work on dietary and lifestyle changes before considering medication therapy. Discussed low fat diet and resuming walking regimen. Will repeat lipid panel with CPE. -The 10-year ASCVD risk score (Arnett DK, et al., 2019) is: 6.3%  Prediabetes: -A1c has improved from 6.4 to 6.2, recommend to continue with reduction of carbohydrates and glucose intake. -Will repeat A1c with CPE.  No orders of the defined types were placed in this encounter.   Follow-up: Return in about 6 months (around 12/02/2021) for CPE and FBW.    MLorrene Reid PA-C

## 2021-07-23 LAB — COLOGUARD: COLOGUARD: NEGATIVE

## 2021-12-02 ENCOUNTER — Encounter: Payer: Medicare PPO | Admitting: Nurse Practitioner

## 2022-02-18 IMAGING — MG MM DIGITAL SCREENING BILAT W/ TOMO AND CAD
8 series · 8 of 24 positions shown · non-contrast
Comparison: Previous exam(s).

Addendum:
:
Please Insert Correct Screening Template

ADDENDUM:
The report should read as follows:
CLINICAL DATA: Screening.
EXAM:
DIGITAL SCREENING BILATERAL MAMMOGRAM WITH TOMOSYNTHESIS AND CAD
TECHNIQUE: Bilateral screening digital craniocaudal and mediolateral oblique
mammograms were obtained. Bilateral screening digital breast
tomosynthesis was performed. The images were evaluated with
computer-aided detection.

[L CC synth-2D]
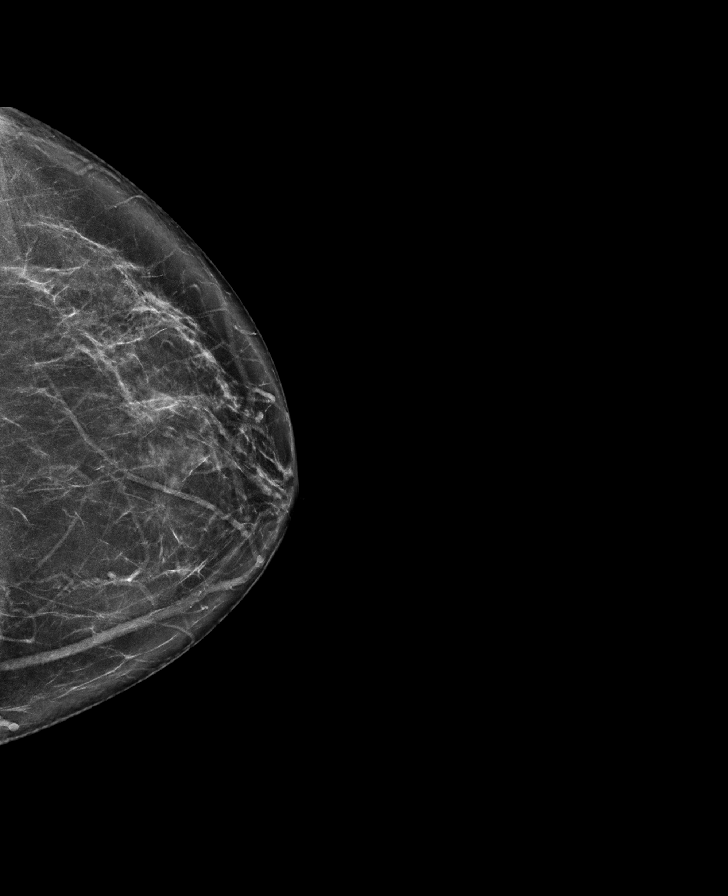

[L MLO synth-2D]
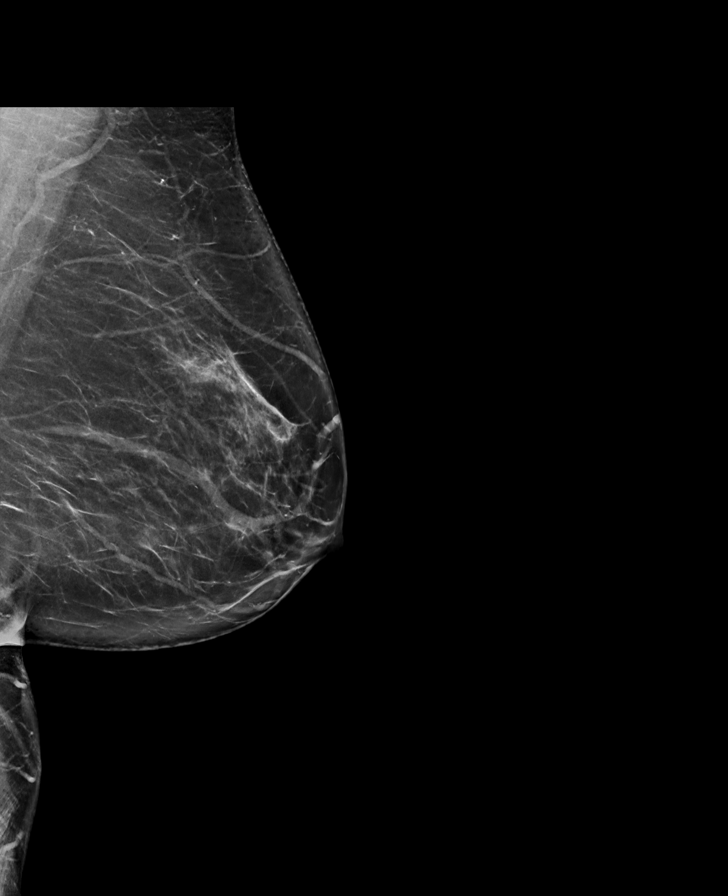

[R CC synth-2D]
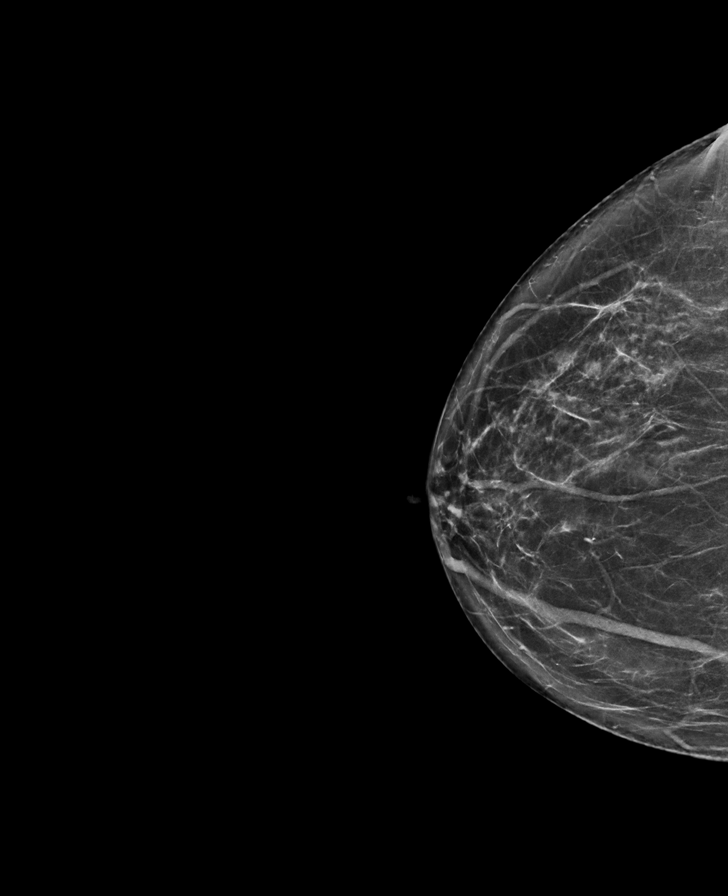

[R MLO synth-2D]
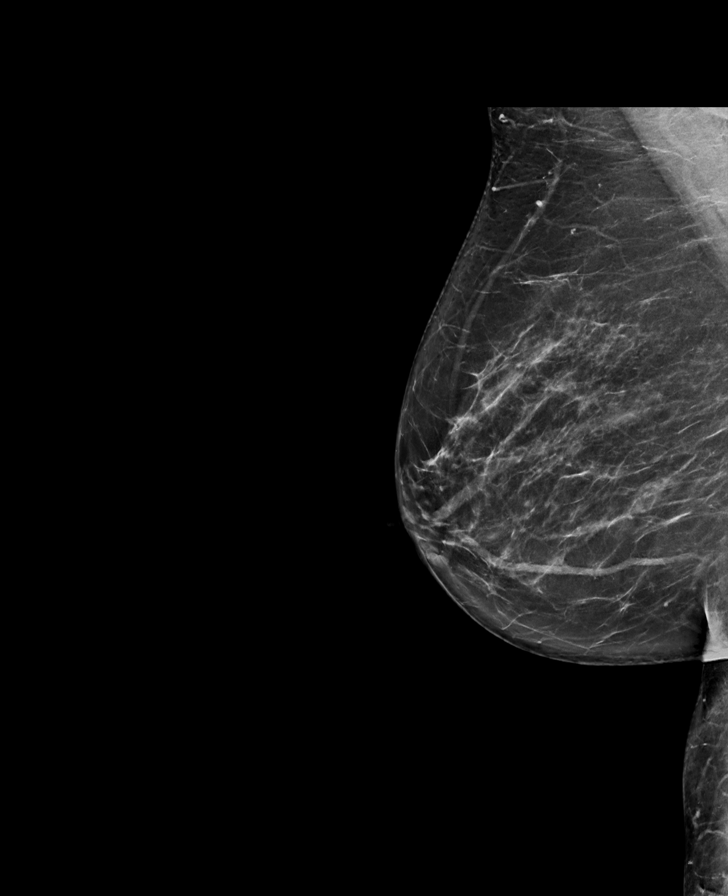

[L CC tomo · tomo slice 41/81.0]
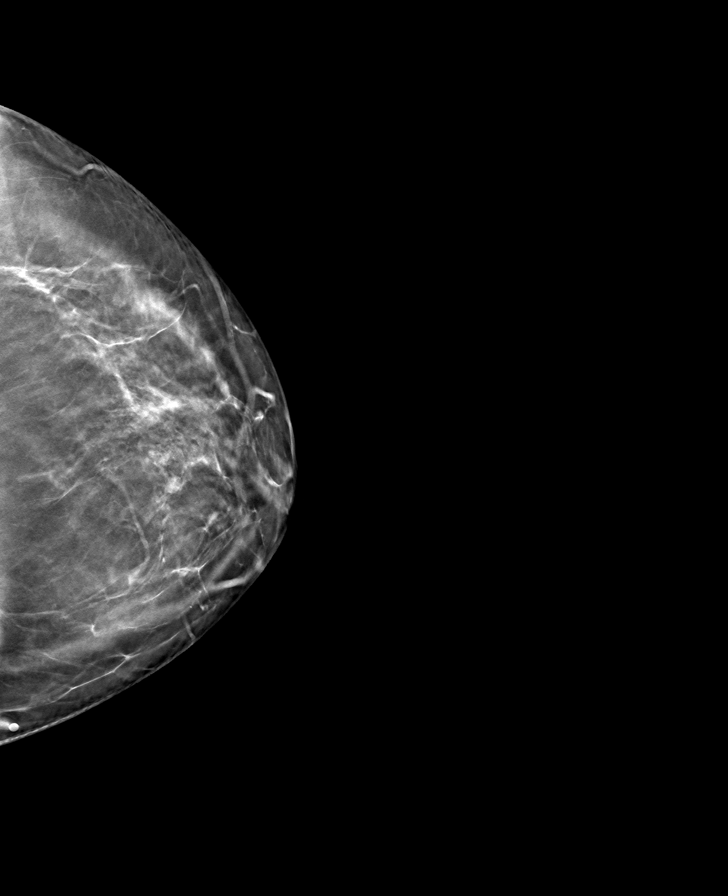

[R MLO tomo · tomo slice 41/82.0]
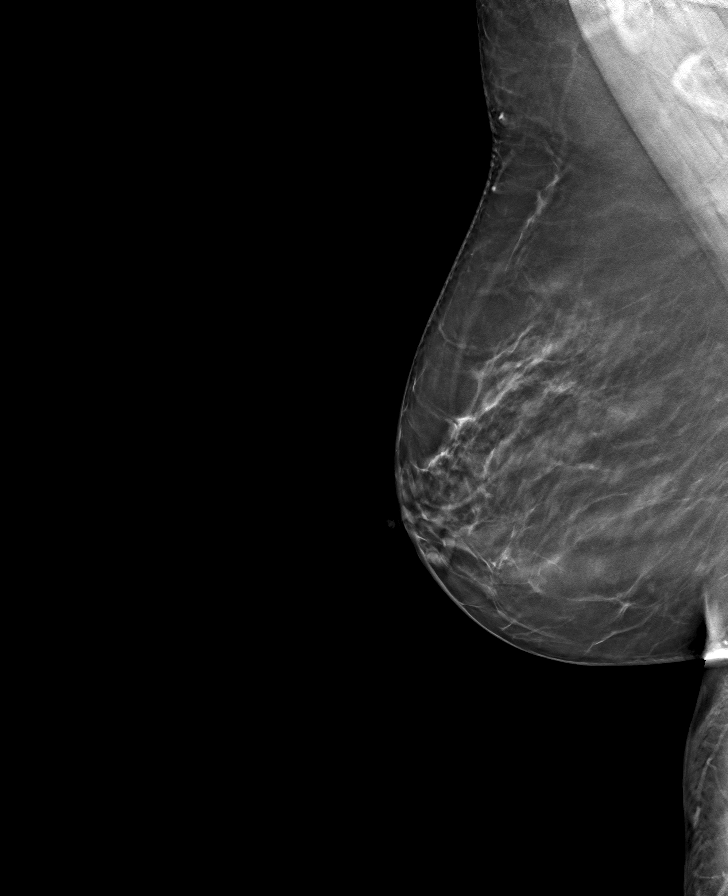

[R CC tomo · tomo slice 39/77.0]
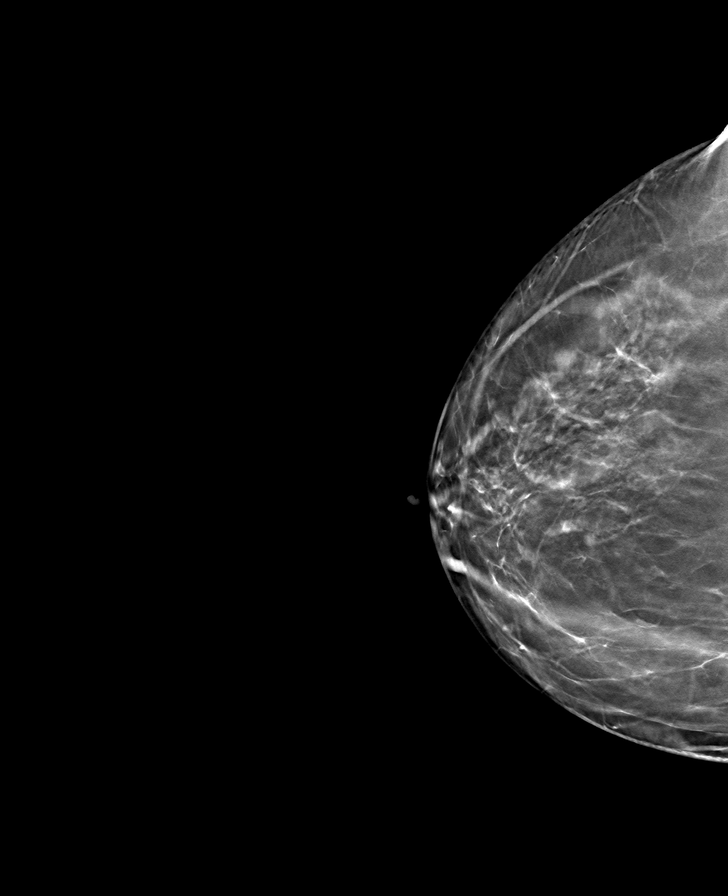

[L MLO tomo · tomo slice 43/84.0]
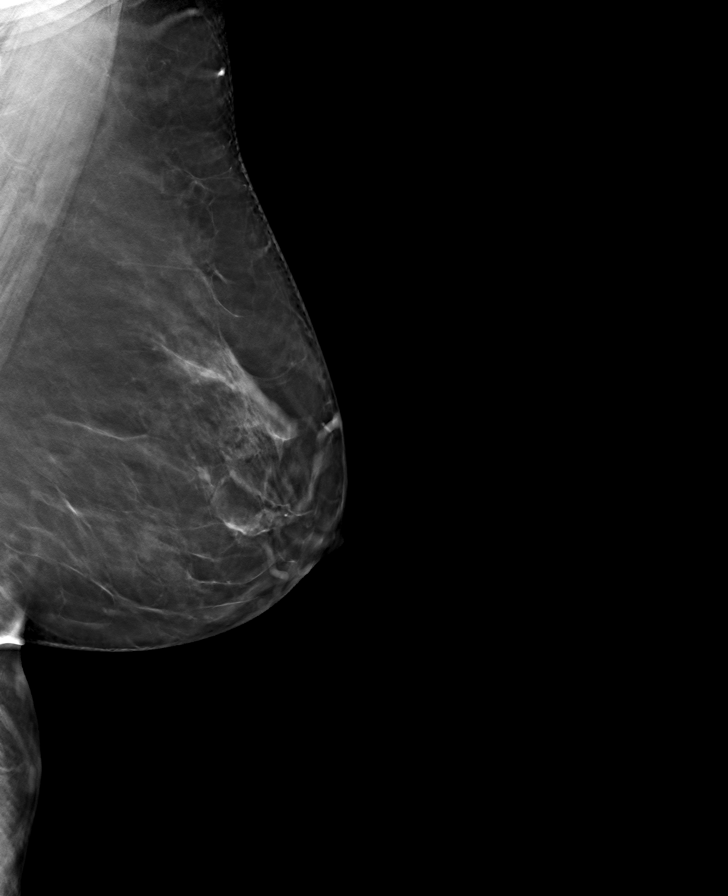

[8 of 24 positions shown; findings below may reference images not displayed]

ACR Breast Density Category b: There are scattered areas of
fibroglandular density.
FINDINGS: There are no findings suspicious for malignancy.
IMPRESSION: No mammographic evidence of malignancy. A result letter of this
screening mammogram will be mailed directly to the patient.

RECOMMENDATION:
Screening mammogram in one year. (Code:4A-C-31V)

BI-RADS CATEGORY  1: Negative.

*** End of Addendum ***
:
Please Insert Correct Screening Template

## 2022-06-14 ENCOUNTER — Other Ambulatory Visit: Payer: Self-pay

## 2022-06-14 ENCOUNTER — Other Ambulatory Visit: Payer: Medicare PPO

## 2022-06-14 DIAGNOSIS — R7303 Prediabetes: Secondary | ICD-10-CM

## 2022-06-14 DIAGNOSIS — E785 Hyperlipidemia, unspecified: Secondary | ICD-10-CM

## 2022-06-14 DIAGNOSIS — Z0000 Encounter for general adult medical examination without abnormal findings: Secondary | ICD-10-CM

## 2022-06-14 DIAGNOSIS — R7989 Other specified abnormal findings of blood chemistry: Secondary | ICD-10-CM

## 2022-06-15 LAB — HEMOGLOBIN A1C
Est. average glucose Bld gHb Est-mCnc: 131 mg/dL
Hgb A1c MFr Bld: 6.2 % — ABNORMAL HIGH (ref 4.8–5.6)

## 2022-06-15 LAB — CBC WITH DIFFERENTIAL/PLATELET
Basophils Absolute: 0.1 10*3/uL (ref 0.0–0.2)
Basos: 1 %
EOS (ABSOLUTE): 0.1 10*3/uL (ref 0.0–0.4)
Eos: 3 %
Hematocrit: 38 % (ref 34.0–46.6)
Hemoglobin: 13 g/dL (ref 11.1–15.9)
Immature Grans (Abs): 0 10*3/uL (ref 0.0–0.1)
Immature Granulocytes: 1 %
Lymphocytes Absolute: 1.2 10*3/uL (ref 0.7–3.1)
Lymphs: 23 %
MCH: 28.8 pg (ref 26.6–33.0)
MCHC: 34.2 g/dL (ref 31.5–35.7)
MCV: 84 fL (ref 79–97)
Monocytes Absolute: 0.7 10*3/uL (ref 0.1–0.9)
Monocytes: 13 %
Neutrophils Absolute: 3.1 10*3/uL (ref 1.4–7.0)
Neutrophils: 59 %
Platelets: 418 10*3/uL (ref 150–450)
RBC: 4.51 x10E6/uL (ref 3.77–5.28)
RDW: 12 % (ref 11.7–15.4)
WBC: 5.2 10*3/uL (ref 3.4–10.8)

## 2022-06-15 LAB — COMPREHENSIVE METABOLIC PANEL
ALT: 22 IU/L (ref 0–32)
AST: 23 IU/L (ref 0–40)
Albumin/Globulin Ratio: 1.4 (ref 1.2–2.2)
Albumin: 3.9 g/dL (ref 3.9–4.9)
Alkaline Phosphatase: 113 IU/L (ref 44–121)
BUN/Creatinine Ratio: 15 (ref 12–28)
BUN: 12 mg/dL (ref 8–27)
Bilirubin Total: 0.5 mg/dL (ref 0.0–1.2)
CO2: 23 mmol/L (ref 20–29)
Calcium: 9.6 mg/dL (ref 8.7–10.3)
Chloride: 100 mmol/L (ref 96–106)
Creatinine, Ser: 0.8 mg/dL (ref 0.57–1.00)
Globulin, Total: 2.7 g/dL (ref 1.5–4.5)
Glucose: 137 mg/dL — ABNORMAL HIGH (ref 70–99)
Potassium: 4.8 mmol/L (ref 3.5–5.2)
Sodium: 137 mmol/L (ref 134–144)
Total Protein: 6.6 g/dL (ref 6.0–8.5)
eGFR: 81 mL/min/{1.73_m2} (ref >59)

## 2022-06-15 LAB — LIPID PANEL
Chol/HDL Ratio: 3.7 ratio (ref 0.0–4.4)
Cholesterol, Total: 174 mg/dL (ref 100–199)
HDL: 47 mg/dL (ref >39)
LDL Chol Calc (NIH): 100 mg/dL — ABNORMAL HIGH (ref 0–99)
Triglycerides: 156 mg/dL — ABNORMAL HIGH (ref 0–149)
VLDL Cholesterol Cal: 27 mg/dL (ref 5–40)

## 2022-06-15 LAB — TSH: TSH: 2.31 u[IU]/mL (ref 0.450–4.500)

## 2022-06-22 ENCOUNTER — Ambulatory Visit (INDEPENDENT_AMBULATORY_CARE_PROVIDER_SITE_OTHER): Payer: Medicare PPO | Admitting: Nurse Practitioner

## 2022-06-22 ENCOUNTER — Encounter: Payer: Self-pay | Admitting: Nurse Practitioner

## 2022-06-22 ENCOUNTER — Encounter: Payer: Medicare PPO | Admitting: Nurse Practitioner

## 2022-06-22 VITALS — BP 138/81 | HR 91 | Ht 63.5 in | Wt 179.1 lb

## 2022-06-22 DIAGNOSIS — E785 Hyperlipidemia, unspecified: Secondary | ICD-10-CM

## 2022-06-22 DIAGNOSIS — R7303 Prediabetes: Secondary | ICD-10-CM | POA: Diagnosis not present

## 2022-06-22 DIAGNOSIS — E2839 Other primary ovarian failure: Secondary | ICD-10-CM

## 2022-06-22 DIAGNOSIS — Z13820 Encounter for screening for osteoporosis: Secondary | ICD-10-CM

## 2022-06-22 DIAGNOSIS — Z0001 Encounter for general adult medical examination with abnormal findings: Secondary | ICD-10-CM | POA: Diagnosis not present

## 2022-06-22 DIAGNOSIS — Z1231 Encounter for screening mammogram for malignant neoplasm of breast: Secondary | ICD-10-CM

## 2022-06-22 NOTE — Progress Notes (Signed)
Complete physical exam   Patient: Erin Moses   DOB: 11-19-1955   66 y.o. Female  MRN: [id number] Visit Date: 06/22/2022    Chief Complaint  Patient presents with   Annual Exam   Subjective    Erin Moses is a 66 y.o. female who presents today for a complete physical exam.  She reports consuming a  variable  diet.  The patient hikes in park near her home at least twice per week   She generally feels well. She does not have additional problems to discuss today.   HPI  Annual physical -due for bone density and screening mammogram  -did cologuard 07/2021 with negative results  -will get flu, covid, shingles, and pneumovax at her pharmacy  -routine, fasting labs sone prior to this visit  --glucose at 137 and HgbA1c at 6.2.  --mild elevation of LDL and triglycerides.  --other labs normal   Past Medical History:  Diagnosis Date   Cataract    Chicken pox    Past Surgical History:  Procedure Laterality Date   EYE SURGERY  2015   Cataract Surgery   WISDOM TOOTH EXTRACTION     Social History   Socioeconomic History   Marital status: Married    Spouse name: Not on file   Number of children: Not on file   Years of education: Not on file   Highest education level: Not on file  Occupational History   Not on file  Tobacco Use   Smoking status: Former    Packs/day: 1.50    Years: 32.00    Total pack years: 48.00    Types: Cigarettes    Quit date: 06/30/2007    Years since quitting: 15.0   Smokeless tobacco: Never  Substance and Sexual Activity   Alcohol use: Yes    Alcohol/week: 8.0 standard drinks of alcohol    Types: 8 Cans of beer per week   Drug use: No   Sexual activity: Yes    Partners: Male    Birth control/protection: Post-menopausal    Comment: Husband   Other Topics Concern   Not on file  Social History Narrative   Married   Veterinary surgeon- highest level of education   Caffeine- 3-4 cups of coffee in the morning    Children- 1     Social Determinants of Health   Financial Resource Strain: Not on file  Food Insecurity: Not on file  Transportation Needs: Not on file  Physical Activity: Not on file  Stress: Not on file  Social Connections: Not on file  Intimate Partner Violence: Not on file   Family Status  Relation Name Status   Mother  Alive   Father  Deceased   MGM  Deceased   MGF  Deceased   PGM  Deceased   PGF  Deceased   Family History  Problem Relation Age of Onset   Hypertension Mother    Alcohol abuse Father    Diabetes Father    Heart disease Paternal Grandfather    No Known Allergies  Patient Care Team: Lorrene Reid, PA-C as PCP - General (Physician Assistant)   Medications: Outpatient Medications Prior to Visit  Medication Sig   aspirin 325 MG tablet Take 1 tablet by mouth daily.   diphenhydrAMINE (BENADRYL) 25 mg capsule Take 25 mg by mouth at bedtime. Take one or two capsules at bedtime as needed   Melatonin 10 MG TBCR Take 1 tablet by mouth at bedtime as needed.   Multiple  Vitamin (MULTI VITAMIN DAILY PO) Take 1 tablet by mouth daily.   No facility-administered medications prior to visit.    Review of Systems  Constitutional:  Negative for activity change, appetite change, chills, fatigue and fever.  HENT:  Negative for congestion, postnasal drip, rhinorrhea, sinus pressure, sinus pain, sneezing and sore throat.   Eyes: Negative.   Respiratory:  Negative for cough, chest tightness, shortness of breath and wheezing.   Cardiovascular:  Negative for chest pain and palpitations.  Gastrointestinal:  Negative for abdominal pain, constipation, diarrhea, nausea and vomiting.  Endocrine: Negative for cold intolerance, heat intolerance, polydipsia and polyuria.  Genitourinary:  Negative for dyspareunia, dysuria, flank pain, frequency and urgency.  Musculoskeletal:  Negative for arthralgias, back pain and myalgias.  Skin:  Negative for rash.  Allergic/Immunologic: Negative for  environmental allergies.  Neurological:  Negative for dizziness, weakness and headaches.  Hematological:  Negative for adenopathy.  Psychiatric/Behavioral:  The patient is not nervous/anxious.   All other systems reviewed and are negative.   Last CBC Lab Results  Component Value Date   WBC 5.2 06/14/2022   HGB 13.0 06/14/2022   HCT 38.0 06/14/2022   MCV 84 06/14/2022   MCH 28.8 06/14/2022   RDW 12.0 06/14/2022   PLT 418 03/54/6568   Last metabolic panel Lab Results  Component Value Date   GLUCOSE 137 (H) 06/14/2022   NA 137 06/14/2022   K 4.8 06/14/2022   CL 100 06/14/2022   CO2 23 06/14/2022   BUN 12 06/14/2022   CREATININE 0.80 06/14/2022   EGFR 81 06/14/2022   CALCIUM 9.6 06/14/2022   PROT 6.6 06/14/2022   ALBUMIN 3.9 06/14/2022   LABGLOB 2.7 06/14/2022   AGRATIO 1.4 06/14/2022   BILITOT 0.5 06/14/2022   ALKPHOS 113 06/14/2022   AST 23 06/14/2022   ALT 22 06/14/2022   Last lipids Lab Results  Component Value Date   CHOL 174 06/14/2022   HDL 47 06/14/2022   LDLCALC 100 (H) 06/14/2022   TRIG 156 (H) 06/14/2022   CHOLHDL 3.7 06/14/2022   Last hemoglobin A1c Lab Results  Component Value Date   HGBA1C 6.2 (H) 06/14/2022   Last thyroid functions Lab Results  Component Value Date   TSH 2.310 06/14/2022   T3TOTAL 124 12/01/2020   Last vitamin D Lab Results  Component Value Date   VD25OH 37.86 07/23/2015       Objective     Today's Vitals   06/22/22 1356  BP: 138/81  Pulse: 91  SpO2: 94%  Weight: 179 lb 1.9 oz (81.2 kg)  Height: 5' 3.5" (1.613 m)   Body mass index is 31.23 kg/m.  BP Readings from Last 3 Encounters:  06/22/22 138/81  06/04/21 134/82  12/04/20 122/80    Wt Readings from Last 3 Encounters:  06/22/22 179 lb 1.9 oz (81.2 kg)  06/04/21 192 lb (87.1 kg)  12/04/20 193 lb 6.4 oz (87.7 kg)     Physical Exam Vitals and nursing note reviewed.  Constitutional:      Appearance: Normal appearance. She is well-developed.  HENT:      Head: Normocephalic and atraumatic.     Right Ear: Tympanic membrane, ear canal and external ear normal.     Left Ear: Tympanic membrane, ear canal and external ear normal.     Nose: Nose normal.     Mouth/Throat:     Mouth: Mucous membranes are moist.     Pharynx: Oropharynx is clear.  Eyes:     Extraocular  Movements: Extraocular movements intact.     Conjunctiva/sclera: Conjunctivae normal.     Pupils: Pupils are equal, round, and reactive to light.  Neck:     Vascular: No carotid bruit.  Cardiovascular:     Rate and Rhythm: Normal rate and regular rhythm.     Pulses: Normal pulses.     Heart sounds: Normal heart sounds.  Pulmonary:     Effort: Pulmonary effort is normal.     Breath sounds: Normal breath sounds.  Abdominal:     General: Bowel sounds are normal. There is no distension.     Palpations: Abdomen is soft. There is no mass.     Tenderness: There is no abdominal tenderness. There is no right CVA tenderness, left CVA tenderness, guarding or rebound.     Hernia: No hernia is present.  Musculoskeletal:        General: Normal range of motion.     Cervical back: Normal range of motion and neck supple.  Lymphadenopathy:     Cervical: No cervical adenopathy.  Skin:    General: Skin is warm and dry.     Capillary Refill: Capillary refill takes less than 2 seconds.  Neurological:     General: No focal deficit present.     Mental Status: She is alert and oriented to person, place, and time.  Psychiatric:        Mood and Affect: Mood normal.        Behavior: Behavior normal.        Thought Content: Thought content normal.        Judgment: Judgment normal.     Last depression screening scores   Row Labels 06/22/2022    2:00 PM 06/04/2021    3:31 PM 12/04/2020    2:10 PM  PHQ 2/9 Scores   Section Header. No data exists in this row.     PHQ - 2 Score   0 0 0  PHQ- 9 Score   0 2 1   Last fall risk screening   Row Labels 06/22/2022    2:01 PM  [place]. No data exists in this row.   Falls in the past year?   0  Number falls in past yr:   0  Injury with Fall?   0  Follow up   Falls evaluation completed    Assessment & Plan    1. Encounter for general adult medical examination with abnormal findings Annual physical today. Will get immunization report from pharmacy to review and update patient chart.   2. Prediabetes Advised patient to lnake of carbohydrates and sugar and to increase water.  Recheck HgbA1c as indicated.   3. Hyperlipidemia LDL goal <100 Recommend patient limit intake of fried and fatty foods. She should increase intake of lean proteins and green leafy vegetables. Adding exercise into daily routine will also be beneficial.    4. Estrogen deficiency Bone density test ordered today  - DG Bone Density; Future  5. Screening for osteoporosis Bone density test ordered toda - DG Bone Density; Future  6. Encounter for screening mammogram for malignant neoplasm of breast Order placed for screening mammogram.     Immunization History  Administered Date(s) Administered   Tdap 02/10/2010, 12/04/2020   Unspecified SARS-COV-2 Vaccination 12/13/2019, 12/27/2019, 08/27/2020   Zoster Recombinat (Shingrix) 03/21/2018    Health Maintenance  Topic Date Due   Medicare Annual Wellness (AWV)  Never done   Zoster Vaccines- Shingrix (2 of  2) 05/16/2018   COVID-19 Vaccine (4 - Mixed Product series) 10/22/2020   Pneumonia Vaccine 20+ Years old (1 - PCV) Never done   DEXA SCAN  Never done   INFLUENZA VACCINE  Never done   MAMMOGRAM  05/30/2023   Fecal DNA (Cologuard)  07/17/2024   TETANUS/TDAP  12/05/2030   Hepatitis C Screening  Completed   HPV VACCINES  Aged Out    Discussed health benefits of physical activity, and encouraged her to engage in regular exercise appropriate for her age and condition.  Problem List Items Addressed This Visit   None Visit Diagnoses     Estrogen deficiency    -  Primary    Relevant Orders   DG Bone Density   Screening for osteoporosis       Relevant Orders   DG Bone Density   Encounter for screening mammogram for malignant neoplasm of breast            Return in about 1 year (around 06/23/2023) for medicare wellness, FBW a week prior to visit.        Ronnell Freshwater, NP  [facility] at [facility] [phone] (phone) [phone] (fax)  [place]

## 2022-06-22 NOTE — Progress Notes (Deleted)
Complete physical exam   Patient: Erin Moses   DOB: [birth year]   66 y.o. Female  MRN: [id number] Visit Date: 06/22/2022    No chief complaint on file.  Subjective    Erin Moses is a 66 y.o. female who presents today for a complete physical exam.  She reports consuming a {diet types:17450} diet. {Exercise:19826} She generally feels {well/fairly well/poorly:18703}. She {does/does not:200015} have additional problems to discuss today.   HPI  ***  Past Medical History:  Diagnosis Date   Cataract    Chicken pox    Past Surgical History:  Procedure Laterality Date   EYE SURGERY  2015   Cataract Surgery   WISDOM TOOTH EXTRACTION     Social History   Socioeconomic History   Marital status: Married    Spouse name: Not on file   Number of children: Not on file   Years of education: Not on file   Highest education level: Not on file  Occupational History   Not on file  Tobacco Use   Smoking status: Former    Packs/day: 1.50    Years: 32.00    Total pack years: 48.00    Types: Cigarettes    Quit date: 06/30/2007    Years since quitting: 14.9   Smokeless tobacco: Never  Substance and Sexual Activity   Alcohol use: Yes    Alcohol/week: 8.0 standard drinks of alcohol    Types: 8 Cans of beer per week   Drug use: No   Sexual activity: Yes    Partners: Male    Birth control/protection: Post-menopausal    Comment: Husband   Other Topics Concern   Not on file  Social History Narrative   Married   Programme researcher, broadcasting/film/video- highest level of education   Caffeine- 3-4 cups of coffee in the morning    Children- 1    Social Determinants of Health   Financial Resource Strain: Not on file  Food Insecurity: Not on file  Transportation Needs: Not on file  Physical Activity: Not on file  Stress: Not on file  Social Connections: Not on file  Intimate Partner Violence: Not on file   Family Status  Relation Name Status   Mother  Alive   Father  Deceased   MGM   Deceased   MGF  Deceased   PGM  Deceased   PGF  Deceased   Family History  Problem Relation Age of Onset   Hypertension Mother    Alcohol abuse Father    Diabetes Father    Heart disease Paternal Grandfather    No Known Allergies  Patient Care Team: Mayer Masker, PA-C as PCP - General (Physician Assistant)   Medications: Outpatient Medications Prior to Visit  Medication Sig   aspirin 325 MG tablet Take 1 tablet by mouth daily.   diphenhydrAMINE (BENADRYL) 25 mg capsule Take 25 mg by mouth at bedtime. Take one or two capsules at bedtime as needed   Melatonin 10 MG TBCR Take 1 tablet by mouth at bedtime as needed.   Multiple Vitamin (MULTI VITAMIN DAILY PO) Take 1 tablet by mouth daily.   No facility-administered medications prior to visit.    Review of Systems  {Labs (Optional):23779}   Objective    There were no vitals filed for this visit. There is no height or weight on file to calculate BMI.  BP Readings from Last 3 Encounters:  06/04/21 134/82  12/04/20 122/80  11/24/20 137/77    Wt Readings from Last  3 Encounters:  06/04/21 192 lb (87.1 kg)  12/04/20 193 lb 6.4 oz (87.7 kg)  11/24/20 193 lb 12.8 oz (87.9 kg)     Physical Exam  ***  Last depression screening scores   Row Labels 06/04/2021    3:31 PM 12/04/2020    2:10 PM 11/24/2020   12:46 PM  PHQ 2/9 Scores   Section Header. No data exists in this row.     PHQ - 2 Score   0 0 0  PHQ- 9 Score   2 1 1    Last fall risk screening   Row Labels 06/04/2021    3:29 PM  Fall Risk    Section Header. No data exists in this row.   Falls in the past year?   0  Number falls in past yr:   0  Injury with Fall?   0  Risk for fall due to :   No Fall Risks  Follow up   Falls evaluation completed   Last Audit-C alcohol use screening   No data to display    A score of 3 or more in women, and 4 or more in men indicates increased risk for alcohol abuse, EXCEPT if all of the points are from question 1   No  results found for any visits on 06/22/22.  Assessment & Plan    Routine Health Maintenance and Physical Exam  Exercise Activities and Dietary recommendations  Goals   None     Immunization History  Administered Date(s) Administered   Tdap 02/10/2010, 12/04/2020   Unspecified SARS-COV-2 Vaccination 12/13/2019, 12/27/2019, 08/27/2020   Zoster Recombinat (Shingrix) 03/21/2018    Health Maintenance  Topic Date Due   COLONOSCOPY (Pts 45-65yrs Insurance coverage will need to be confirmed)  Never done   Zoster Vaccines- Shingrix (2 of 2) 05/16/2018   COVID-19 Vaccine (4 - Mixed Product series) 10/22/2020   Pneumonia Vaccine 23+ Years old (1 - PCV) Never done   DEXA SCAN  Never done   INFLUENZA VACCINE  Never done   MAMMOGRAM  05/30/2023   TETANUS/TDAP  12/05/2030   Hepatitis C Screening  Completed   HPV VACCINES  Aged Out    Discussed health benefits of physical activity, and encouraged her to engage in regular exercise appropriate for her age and condition.  Problem List Items Addressed This Visit   None    No follow-ups on file.        Ronnell Freshwater, NP  [facility] at [facility] [phone] (phone) [phone] (fax)  [place]

## 2022-07-12 DIAGNOSIS — E2839 Other primary ovarian failure: Secondary | ICD-10-CM | POA: Insufficient documentation

## 2022-07-12 DIAGNOSIS — E785 Hyperlipidemia, unspecified: Secondary | ICD-10-CM

## 2022-07-12 DIAGNOSIS — R7303 Prediabetes: Secondary | ICD-10-CM | POA: Insufficient documentation

## 2022-07-12 HISTORY — DX: Hyperlipidemia, unspecified: E78.5

## 2022-07-12 HISTORY — DX: Prediabetes: R73.03

## 2024-03-23 ENCOUNTER — Telehealth: Payer: Self-pay

## 2024-03-23 NOTE — Telephone Encounter (Signed)
 Copied from CRM [id number]. Topic: Appointments - Transfer of Care >> Mar 23, 2024 11:20 AM [name] wrote: Pt is requesting to transfer FROM: Erin Moses Pt is requesting to transfer TO: MD [facility] Spencer Reason for requested transfer: the office does not answer the phone,they act they do not care and she did not have a good experience It is the responsibility of the team the patient would like to transfer to (Dr. Watt Mirza) to reach out to the patient if for any reason this transfer is not acceptable. [phone]

## 2024-03-23 NOTE — Telephone Encounter (Signed)
 Looks like appt has already been scheduled with Dr. Ubaldo on 06/28/24; transfer okay?

## 2024-06-26 ENCOUNTER — Encounter: Payer: Self-pay | Admitting: Family Medicine

## 2024-06-26 NOTE — Progress Notes (Unsigned)
 [name], MD, CAQ Sports Medicine [facility] at [facility] [address] [place], [address]  Phone: [phone]  FAX: [phone]  Erin Moses - 68 y.o. female  MRN [id number]  Date of Birth: May 05, 1956  Date: 06/28/2024  PCP: Watt Mirza, MD  Referral: No ref. provider found  Chief Complaint  Patient presents with   Establish Care   Subjective:   Erin Moses is a 68 y.o. very pleasant female patient with Body mass index is 34.81 kg/m. who presents with the following:  Discussed the use of AI scribe software for clinical note transcription with the patient, who gave verbal consent to proceed.  The patient is here to establish care with me in our office and with [facility].  She did see about [employer] about 10 years ago.  She does have a history of prediabetes, and her most recent A1c is 6.2.  She also has a history of hyperlipidemia.  She does have a roughly 50-year pack history.  Currently not smoking.  BP Readings from Last 3 Encounters:  06/28/24 (!) 150/82  06/22/22 138/81  06/04/21 134/82     History of Present Illness Erin Moses is a 68 year old female who presents for a routine check-up.  She has a history of prediabetes with previously elevated blood sugar levels, though she has not been diagnosed with diabetes. Her last blood sugar check was in October 2023.  She has a history of elevated cholesterol levels, with a significant improvement noted in her last check-up in October 2023, where her cholesterol dropped by 50 points from 229 in September 2022. She has never been on medication for cholesterol.  She has a history of hypertension, with home blood pressure readings typically ranging from 145-150/80-85 mmHg. She has not been on any blood pressure medication. Her blood pressure is sometimes normal but usually above 140/80 mmHg.  She quit smoking 15 years ago after smoking one and a  half packs per day for many years. She drinks wine regularly, about three to four glasses, five days a week.  She underwent cataract surgery in both eyes in the past.  In terms of her social history, she is married and has been for over 40 years. She has one daughter who is a Engineer, civil (consulting) and two granddaughters, aged two and a half and nine months, for whom she provides daycare. She enjoys hiking and traveling, having recently taken a cruise to the [place].  She reports a change in her eating habits since retiring, moving from a disciplined diet to less healthy options. She used to have salads for lunch and oatmeal for breakfast but now opts for [place] toast and larger meals.  She has not received a flu shot this year but had the flu in February of the previous year. She has not had a pneumonia vaccine either.   Health Maintenance  Topic Date Due   Medicare Annual Wellness (AWV)  Never done   DEXA SCAN  Never done   Mammogram  05/30/2023   COVID-19 Vaccine (5 - 2025-26 season) 07/14/2024 (Originally 05/14/2024)   Zoster Vaccines- Shingrix  (2 of 2) 09/28/2024 (Originally 05/16/2018)   Pneumococcal Vaccine: 50+ Years (1 of 1 - PCV) 06/28/2025 (Originally 03/16/2006)   Fecal DNA (Cologuard)  07/17/2024   DTaP/Tdap/Td (3 - Td or Tdap) 12/05/2030   Influenza Vaccine  Completed   Hepatitis C Screening  Completed   Meningococcal B Vaccine  Aged Out  Review of Systems is noted in the HPI, as appropriate  Objective:   BP (!) 150/82   Pulse 96   Temp 98.6 F (37 C) (Temporal)   Ht 5' 1 (1.549 m)   Wt 184 lb 4 oz (83.6 kg)   SpO2 97%   BMI 34.81 kg/m   GEN: No acute distress; alert,appropriate. CV: RRR, no m/g/r  PULM: Normal respiratory rate, no accessory muscle use. No wheezes, crackles or rhonchi  PSYCH: Normally interactive.   Laboratory and Imaging Data:  Assessment and Plan:     ICD-10-CM   1. Hyperlipidemia LDL goal <100  E78.5 Lipid panel    2. Smoking history  Z87.891      3. Prediabetes  R73.03 Basic metabolic panel with GFR    Hemoglobin A1c    4. Other fatigue  R53.83 Basic metabolic panel with GFR    CBC with Differential/Platelet    Hepatic function panel    TSH    5. Vitamin D  deficiency  E55.9 VITAMIN D  25 Hydroxy (Vit-D Deficiency, Fractures)    6. Need for influenza vaccination  Z23 Flu vaccine trivalent PF, 6mos and older(Flulaval,Afluria,Fluarix,Fluzone)     Assessment & Plan Hypertension Chronic hypertension with home readings 145-150/80-90 mmHg. No prior antihypertensive use. Suboptimal diet and potential weight gain noted. - Encouraged dietary modifications for nutrition and weight loss. - Advised regular home blood pressure monitoring. - Plan to reassess blood pressure control in a few months for potential medication need.  Hyperlipidemia Elevated cholesterol with improvement noted in October 2023. No lipid-lowering medication used. - Ordered fasting blood draw to reassess cholesterol levels.  Prediabetes Elevated blood sugars indicating prediabetes. No diabetes medication used. - Ordered fasting blood draw to reassess blood sugar levels.  Unhealthy alcohol use Regular wine consumption, 3-4 glasses each day on 5 days per week. Aware of health risks including liver strain and cancer risk. - Discussed risks of excessive alcohol consumption, including liver damage and increased cancer risk. - Encouraged reduction in alcohol intake.  General Health Maintenance Due for routine vaccinations and screenings. No flu shot this year. No pneumonia vaccination history. - Administered flu shot today. - Discussed and offered Prevnar 20 pneumonia vaccine for future visit. - Ordered fasting blood draw for comprehensive health assessment, including liver function tests.  Medication Management during today's office visit: No orders of the defined types were placed in this encounter.  There are no discontinued medications.  Orders placed today  for conditions managed today: Orders Placed This Encounter  Procedures   Flu vaccine trivalent PF, 6mos and older(Flulaval,Afluria,Fluarix,Fluzone)   Basic metabolic panel with GFR   CBC with Differential/Platelet   Hepatic function panel   Hemoglobin A1c   Lipid panel   TSH   VITAMIN D  25 Hydroxy (Vit-D Deficiency, Fractures)    Disposition: No follow-ups on file.  [employer] speech-to-text software was used for transcription in this dictation.  Possible transcriptional errors can occur using Animal nutritionist.   Signed,  Jacques DASEN. Desmen Schoffstall, MD   Outpatient Encounter Medications as of 06/28/2024  Medication Sig   aspirin 325 MG tablet Take 1 tablet by mouth daily.   diphenhydrAMINE (BENADRYL) 25 mg capsule Take 25 mg by mouth at bedtime. Take one or two capsules at bedtime as needed   Melatonin 10 MG TBCR Take 1 tablet by mouth at bedtime as needed.   Multiple Vitamin (MULTI VITAMIN DAILY PO) Take 1 tablet by mouth daily.   No facility-administered encounter medications on file as  of 06/28/2024.

## 2024-06-28 ENCOUNTER — Encounter: Payer: Self-pay | Admitting: Family Medicine

## 2024-06-28 ENCOUNTER — Ambulatory Visit: Admitting: Family Medicine

## 2024-06-28 VITALS — BP 150/82 | HR 96 | Temp 98.6°F | Ht 61.0 in | Wt 184.2 lb

## 2024-06-28 DIAGNOSIS — R5383 Other fatigue: Secondary | ICD-10-CM

## 2024-06-28 DIAGNOSIS — Z87891 Personal history of nicotine dependence: Secondary | ICD-10-CM | POA: Diagnosis not present

## 2024-06-28 DIAGNOSIS — Z23 Encounter for immunization: Secondary | ICD-10-CM | POA: Diagnosis not present

## 2024-06-28 DIAGNOSIS — E559 Vitamin D deficiency, unspecified: Secondary | ICD-10-CM

## 2024-06-28 DIAGNOSIS — E785 Hyperlipidemia, unspecified: Secondary | ICD-10-CM

## 2024-06-28 DIAGNOSIS — R7303 Prediabetes: Secondary | ICD-10-CM | POA: Diagnosis not present

## 2024-07-05 ENCOUNTER — Other Ambulatory Visit (INDEPENDENT_AMBULATORY_CARE_PROVIDER_SITE_OTHER)

## 2024-07-05 DIAGNOSIS — R7303 Prediabetes: Secondary | ICD-10-CM

## 2024-07-05 DIAGNOSIS — E559 Vitamin D deficiency, unspecified: Secondary | ICD-10-CM | POA: Diagnosis not present

## 2024-07-05 DIAGNOSIS — E785 Hyperlipidemia, unspecified: Secondary | ICD-10-CM

## 2024-07-05 DIAGNOSIS — R5383 Other fatigue: Secondary | ICD-10-CM | POA: Diagnosis not present

## 2024-07-05 LAB — LIPID PANEL
Cholesterol: 207 mg/dL — ABNORMAL HIGH (ref 0–200)
HDL: 50.6 mg/dL (ref >39.00)
LDL Cholesterol: 108 mg/dL — ABNORMAL HIGH (ref 0–99)
NonHDL: 156.04
Total CHOL/HDL Ratio: 4
Triglycerides: 242 mg/dL — ABNORMAL HIGH (ref 0.0–149.0)
VLDL: 48.4 mg/dL — ABNORMAL HIGH (ref 0.0–40.0)

## 2024-07-05 LAB — HEPATIC FUNCTION PANEL
ALT: 23 U/L (ref 0–35)
AST: 19 U/L (ref 0–37)
Albumin: 4.1 g/dL (ref 3.5–5.2)
Alkaline Phosphatase: 90 U/L (ref 39–117)
Bilirubin, Direct: 0.1 mg/dL (ref 0.0–0.3)
Total Bilirubin: 0.6 mg/dL (ref 0.2–1.2)
Total Protein: 7 g/dL (ref 6.0–8.3)

## 2024-07-05 LAB — HEMOGLOBIN A1C: Hgb A1c MFr Bld: 6.7 % — ABNORMAL HIGH (ref 4.6–6.5)

## 2024-07-05 LAB — CBC WITH DIFFERENTIAL/PLATELET
Basophils Absolute: 0.1 K/uL (ref 0.0–0.1)
Basophils Relative: 1 % (ref 0.0–3.0)
Eosinophils Absolute: 0.2 K/uL (ref 0.0–0.7)
Eosinophils Relative: 2.8 % (ref 0.0–5.0)
HCT: 42.1 % (ref 36.0–46.0)
Hemoglobin: 13.8 g/dL (ref 12.0–15.0)
Lymphocytes Relative: 30.8 % (ref 12.0–46.0)
Lymphs Abs: 1.8 K/uL (ref 0.7–4.0)
MCHC: 32.8 g/dL (ref 30.0–36.0)
MCV: 88.2 fl (ref 78.0–100.0)
Monocytes Absolute: 0.6 K/uL (ref 0.1–1.0)
Monocytes Relative: 9.3 % (ref 3.0–12.0)
Neutro Abs: 3.4 K/uL (ref 1.4–7.7)
Neutrophils Relative %: 56.1 % (ref 43.0–77.0)
Platelets: 343 K/uL (ref 150.0–400.0)
RBC: 4.77 Mil/uL (ref 3.87–5.11)
RDW: 13.3 % (ref 11.5–15.5)
WBC: 6 K/uL (ref 4.0–10.5)

## 2024-07-05 LAB — BASIC METABOLIC PANEL WITH GFR
BUN: 11 mg/dL (ref 6–23)
CO2: 28 meq/L (ref 19–32)
Calcium: 9.5 mg/dL (ref 8.4–10.5)
Chloride: 98 meq/L (ref 96–112)
Creatinine, Ser: 0.81 mg/dL (ref 0.40–1.20)
GFR: 74.65 mL/min (ref >60.00)
Glucose, Bld: 142 mg/dL — ABNORMAL HIGH (ref 70–99)
Potassium: 4.4 meq/L (ref 3.5–5.1)
Sodium: 137 meq/L (ref 135–145)

## 2024-07-05 LAB — VITAMIN D 25 HYDROXY (VIT D DEFICIENCY, FRACTURES): VITD: 26.82 ng/mL — ABNORMAL LOW (ref 30.00–100.00)

## 2024-07-05 LAB — TSH: TSH: 4.9 u[IU]/mL (ref 0.35–5.50)

## 2024-07-07 ENCOUNTER — Ambulatory Visit: Payer: Self-pay | Admitting: Family Medicine

## 2024-07-07 DIAGNOSIS — E119 Type 2 diabetes mellitus without complications: Secondary | ICD-10-CM

## 2024-07-11 DIAGNOSIS — E119 Type 2 diabetes mellitus without complications: Secondary | ICD-10-CM | POA: Insufficient documentation

## 2024-08-02 ENCOUNTER — Ambulatory Visit (INDEPENDENT_AMBULATORY_CARE_PROVIDER_SITE_OTHER)

## 2024-08-02 ENCOUNTER — Encounter: Payer: Self-pay | Admitting: Family Medicine

## 2024-08-02 VITALS — Ht 61.0 in | Wt 184.0 lb

## 2024-08-02 DIAGNOSIS — Z780 Asymptomatic menopausal state: Secondary | ICD-10-CM

## 2024-08-02 DIAGNOSIS — Z0000 Encounter for general adult medical examination without abnormal findings: Secondary | ICD-10-CM | POA: Diagnosis not present

## 2024-08-02 NOTE — Patient Instructions (Addendum)
 Erin Moses,  Thank you for taking the time for your Medicare Wellness Visit. I appreciate your continued commitment to your health goals. Please review the care plan we discussed, and feel free to reach out if I can assist you further.  Please note that Annual Wellness Visits do not include a physical exam. Some assessments may be limited, especially if the visit was conducted virtually. If needed, we may recommend an in-person follow-up with your provider.  Ongoing Care Seeing your primary care provider every 3 to 6 months helps us  monitor your health and provide consistent, personalized care. Last office visit on 06/28/2024.  You are due for a diabetic eye exam.  Please call to get scheduled for one soon.  You are also due for a foot exam amd a kidney evaluation, which will be done during your next office visit with provider.  Keep up with the exercise, you are doing good.  Referrals If a referral was made during today's visit and you haven't received any updates within two weeks, please contact the referred provider directly to check on the status. You have an order for:  [x]   3D Mammogram  [x]   Bone Density     Please call for appointment:  [facility]  [address] [place] [address] [phone]  Make sure to wear two-piece clothing.  No lotions, powders, or deodorants the day of the appointment. Make sure to bring picture ID and insurance card.  Bring list of medications you are currently taking including any supplements.    Recommended Screenings:  Health Maintenance  Topic Date Due   Complete foot exam   Never done   Eye exam for diabetics  Never done   Yearly kidney health urinalysis for diabetes  Never done   Osteoporosis screening with Bone Density Scan  Never done   Breast Cancer Screening  05/30/2023   COVID-19 Vaccine (5 - 2025-26 season) 05/14/2024   Cologuard (Stool DNA test)  07/17/2024   Zoster  (Shingles) Vaccine (2 of 2) 09/28/2024*   Pneumococcal Vaccine for age over 52 (1 of 2 - PCV) 06/28/2025*   Hemoglobin A1C  01/03/2025   Yearly kidney function blood test for diabetes  07/05/2025   Medicare Annual Wellness Visit  08/02/2025   DTaP/Tdap/Td vaccine (3 - Td or Tdap) 12/05/2030   Flu Shot  Completed   Hepatitis C Screening  Completed   Meningitis B Vaccine  Aged Out  *Topic was postponed. The date shown is not the original due date.       08/02/2024    9:19 AM  Advanced Directives  Does Patient Have a Medical Advance Directive? No    Vision: Annual vision screenings are recommended for early detection of glaucoma, cataracts, and diabetic retinopathy. These exams can also reveal signs of chronic conditions such as diabetes and high blood pressure.  Dental: Annual dental screenings help detect early signs of oral cancer, gum disease, and other conditions linked to overall health, including heart disease and diabetes.  Please see the attached documents for additional preventive care recommendations.

## 2024-08-02 NOTE — Progress Notes (Signed)
 Chief Complaint  Patient presents with   Medicare Wellness     Subjective:   Erin Moses is a 68 y.o. female who presents for a Medicare Annual Wellness Visit.  Allergies (verified) Patient has no known allergies.   History: Past Medical History:  Diagnosis Date   Hyperlipidemia LDL goal <100 07/12/2022   Prediabetes 07/12/2022   Smoking history 07/03/2018   Past Surgical History:  Procedure Laterality Date   CATARACT EXTRACTION, BILATERAL  2015   CESAREAN SECTION CLASSICAL  1994   Family History  Problem Relation Age of Onset   Hypertension Mother    Alcohol abuse Father    Diabetes Father    Heart disease Paternal Grandfather    Social History   Occupational History   Occupation: RETIRED  Tobacco Use   Smoking status: Former    Current packs/day: 0.00    Average packs/day: 1.5 packs/day for 32.0 years (48.0 ttl pk-yrs)    Types: Cigarettes    Start date: 06/30/1975    Quit date: 06/30/2007    Years since quitting: 17.1   Smokeless tobacco: Never  Vaping Use   Vaping status: Never Used  Substance and Sexual Activity   Alcohol use: Yes    Alcohol/week: 8.0 standard drinks of alcohol    Types: 8 Cans of beer per week   Drug use: No   Sexual activity: Yes    Partners: Male    Birth control/protection: Post-menopausal    Comment: Husband    Tobacco Counseling Counseling given: Not Answered  SDOH Screenings   Food Insecurity: No Food Insecurity (08/02/2024)  Housing: Unknown (08/02/2024)  Transportation Needs: No Transportation Needs (08/02/2024)  Utilities: Not At Risk (08/02/2024)  Depression (PHQ2-9): Low Risk  (08/02/2024)  Physical Activity: Sufficiently Active (08/02/2024)  Social Connections: Moderately Isolated (08/02/2024)  Stress: No Stress Concern Present (08/02/2024)  Tobacco Use: Medium Risk (08/02/2024)  Health Literacy: Adequate Health Literacy (08/02/2024)   See flowsheets for full screening details  Depression  Screen PHQ 2 & 9 Depression Scale- Over the past 2 weeks, how often have you been bothered by any of the following problems? Little interest or pleasure in doing things: 0 Feeling down, depressed, or hopeless (PHQ Adolescent also includes...irritable): 0 PHQ-2 Total Score: 0 Trouble falling or staying asleep, or sleeping too much: 1 (falling asleep) Feeling tired or having little energy: 0 Poor appetite or overeating (PHQ Adolescent also includes...weight loss): 0 Feeling bad about yourself - or that you are a failure or have let yourself or your family down: 0 Trouble concentrating on things, such as reading the newspaper or watching television (PHQ Adolescent also includes...like school work): 0 Moving or speaking so slowly that other people could have noticed. Or the opposite - being so fidgety or restless that you have been moving around a lot more than usual: 0 Thoughts that you would be better off dead, or of hurting yourself in some way: 0 PHQ-9 Total Score: 1 If you checked off any problems, how difficult have these problems made it for you to do your work, take care of things at home, or get along with other people?: Not difficult at all  Depression Treatment Depression Interventions/Treatment : EYV7-0 Score <4 Follow-up Not Indicated     Goals Addressed             This Visit's Progress    Patient Stated       Maintain being active/2025       Visit info /  Clinical Intake: Medicare Wellness Visit Type:: Initial Annual Wellness Visit Persons participating in visit:: patient Medicare Wellness Visit Mode:: Telephone If telephone:: video declined Because this visit was a virtual/telehealth visit:: vitals recorded from last visit If Telephone or Video please confirm:: I connected with the patient using audio enabled telemedicine application and verified that I am speaking with the correct person using two identifiers; I discussed the limitations of evaluation and management  by telemedicine; The patient expressed understanding and agreed to proceed Patient Location:: Home Provider Location:: Home Information given by:: patient Pre-visit prep was completed: no AWV questionnaire completed by patient prior to visit?: no Living arrangements:: lives with spouse/significant other Patient's Overall Health Status Rating: good Typical amount of pain: none (per pt-sometimes stiff) Does pain affect daily life?: no Are you currently prescribed opioids?: no  Dietary Habits and Nutritional Risks How many meals a day?: 3 Eats fruit and vegetables daily?: yes (fruits every other day) Most meals are obtained by: preparing own meals In the last 2 weeks, have you had any of the following?: none Diabetic:: (!) yes Any non-healing wounds?: no How often do you check your BS?: 0 Would you like to be referred to a Nutritionist or for Diabetic Management? : no (has been referred)  Functional Status Activities of Daily Living (to include ambulation/medication): Independent Ambulation: Independent Medication Administration: Independent Home Management: Independent Manage your own finances?: yes Primary transportation is: driving Concerns about vision?: no *vision screening is required for WTM* Concerns about hearing?: no  Fall Screening Falls in the past year?: 1 Number of falls in past year: 1 Was there an injury with Fall?: 0 Fall Risk Category Calculator: 2 Patient Fall Risk Level: Moderate Fall Risk  Fall Risk Patient at Risk for Falls Due to: Impaired balance/gait Fall risk Follow up: Falls evaluation completed; Falls prevention discussed  Home and Transportation Safety: All rugs have non-skid backing?: yes All stairs or steps have railings?: yes (outside in front and back) Grab bars in the bathtub or shower?: (!) no Have non-skid surface in bathtub or shower?: yes Good home lighting?: yes Regular seat belt use?: yes Hospital stays in the last year::  no  Cognitive Assessment Difficulty concentrating, remembering, or making decisions? : no Will 6CIT or Mini Cog be Completed: no 6CIT or Mini Cog Declined: patient alert, oriented, able to answer questions appropriately and recall recent events  Advance Directives (For Healthcare) Does Patient Have a Medical Advance Directive?: No  Reviewed/Updated  Reviewed/Updated: Reviewed All (Medical, Surgical, Family, Medications, Allergies, Care Teams, Patient Goals)        Objective:    Today's Vitals   08/02/24 0915  Weight: 184 lb (83.5 kg)  Height: 5' 1 (1.549 m)   Body mass index is 34.77 kg/m.  Current Medications (verified) Outpatient Encounter Medications as of 08/02/2024  Medication Sig   aspirin 325 MG tablet Take 1 tablet by mouth daily.   diphenhydrAMINE (BENADRYL) 25 mg capsule Take 25 mg by mouth at bedtime. Take one or two capsules at bedtime as needed   Melatonin 10 MG TBCR Take 1 tablet by mouth at bedtime as needed.   Multiple Vitamin (MULTI VITAMIN DAILY PO) Take 1 tablet by mouth daily.   No facility-administered encounter medications on file as of 08/02/2024.   Hearing/Vision screen Vision Screening - Comments:: Had cataract surgery/Dr. [facility]/Not UTD Immunizations and Health Maintenance Health Maintenance  Topic Date Due   Medicare Annual Wellness (AWV)  Never done   FOOT EXAM  Never done   OPHTHALMOLOGY EXAM  Never done   Diabetic kidney evaluation - Urine ACR  Never done   Bone Density Scan  Never done   Mammogram  05/30/2023   COVID-19 Vaccine (5 - 2025-26 season) 05/14/2024   Fecal DNA (Cologuard)  07/17/2024   Zoster Vaccines- Shingrix  (2 of 2) 09/28/2024 (Originally 05/16/2018)   Pneumococcal Vaccine: 50+ Years (1 of 2 - PCV) 06/28/2025 (Originally 03/17/1975)   HEMOGLOBIN A1C  01/03/2025   Diabetic kidney evaluation - eGFR measurement  07/05/2025   DTaP/Tdap/Td (3 - Td or Tdap) 12/05/2030   Influenza Vaccine  Completed    Hepatitis C Screening  Completed   Meningococcal B Vaccine  Aged Out        Assessment/Plan:  This is a routine wellness examination for Yaslyn.  Patient Care Team: Watt Mirza, MD as PCP - General (Family Medicine)  I have personally reviewed and noted the following in the patient's chart:   Medical and social history Use of alcohol, tobacco or illicit drugs  Current medications and supplements including opioid prescriptions. Functional ability and status Nutritional status Physical activity Advanced directives List of other physicians Hospitalizations, surgeries, and ER visits in previous 12 months Vitals Screenings to include cognitive, depression, and falls Referrals and appointments  No orders of the defined types were placed in this encounter.  In addition, I have reviewed and discussed with patient certain preventive protocols, quality metrics, and best practice recommendations. A written personalized care plan for preventive services as well as general preventive health recommendations were provided to patient.   Shila Kruczek L Uriah Philipson, CMA   08/02/2024   No follow-ups on file.  After Visit Summary: (MyChart) Due to this being a telephonic visit, the after visit summary with patients personalized plan was offered to patient via MyChart   Nurse Notes: Patient is due for a foot exam and a UACR, which can be done during her next office visit.  She is due for a diabetic eye exam.  Patient is also due for a DEXA and a mammogram which orders have been placed today.  She had no other concerns to address today.

## 2024-08-17 ENCOUNTER — Ambulatory Visit: Payer: Self-pay | Admitting: Family Medicine

## 2024-08-17 LAB — COLOGUARD: COLOGUARD: NEGATIVE

## 2024-09-17 ENCOUNTER — Other Ambulatory Visit: Payer: Self-pay | Admitting: Family Medicine

## 2024-09-17 DIAGNOSIS — Z1231 Encounter for screening mammogram for malignant neoplasm of breast: Secondary | ICD-10-CM

## 2024-09-17 DIAGNOSIS — Z780 Asymptomatic menopausal state: Secondary | ICD-10-CM

## 2024-10-10 ENCOUNTER — Ambulatory Visit
Admission: RE | Admit: 2024-10-10 | Discharge: 2024-10-10 | Disposition: A | Source: Ambulatory Visit | Attending: Family Medicine | Admitting: Family Medicine

## 2024-10-10 ENCOUNTER — Ambulatory Visit
Admission: RE | Admit: 2024-10-10 | Discharge: 2024-10-10 | Disposition: A | Discharge status: Home / Self Care | Source: Ambulatory Visit | Attending: Family Medicine | Admitting: Family Medicine

## 2024-10-10 DIAGNOSIS — Z780 Asymptomatic menopausal state: Secondary | ICD-10-CM

## 2024-10-10 DIAGNOSIS — Z1231 Encounter for screening mammogram for malignant neoplasm of breast: Secondary | ICD-10-CM | POA: Insufficient documentation

## 2024-10-15 ENCOUNTER — Other Ambulatory Visit: Payer: Self-pay | Admitting: Family Medicine

## 2024-10-15 ENCOUNTER — Ambulatory Visit: Payer: Self-pay | Admitting: Family Medicine

## 2024-10-15 DIAGNOSIS — R928 Other abnormal and inconclusive findings on diagnostic imaging of breast: Secondary | ICD-10-CM

## 2024-10-25 ENCOUNTER — Other Ambulatory Visit

## 2024-10-25 ENCOUNTER — Encounter
